# Patient Record
Sex: Male | Born: 1975 | Race: White | Hispanic: No | Marital: Single | Smoking: Former smoker
Health system: Southern US, Community
[De-identification: ages and names within clinical notes are randomized; demographics above are authoritative.]

## PROBLEM LIST (undated history)

## (undated) DIAGNOSIS — F419 Anxiety disorder, unspecified: Secondary | ICD-10-CM

## (undated) DIAGNOSIS — K625 Hemorrhage of anus and rectum: Secondary | ICD-10-CM

## (undated) DIAGNOSIS — E785 Hyperlipidemia, unspecified: Secondary | ICD-10-CM

## (undated) DIAGNOSIS — Z87442 Personal history of urinary calculi: Secondary | ICD-10-CM

## (undated) DIAGNOSIS — M549 Dorsalgia, unspecified: Secondary | ICD-10-CM

## (undated) DIAGNOSIS — Z8249 Family history of ischemic heart disease and other diseases of the circulatory system: Secondary | ICD-10-CM

## (undated) DIAGNOSIS — N200 Calculus of kidney: Secondary | ICD-10-CM

## (undated) DIAGNOSIS — N201 Calculus of ureter: Secondary | ICD-10-CM

## (undated) DIAGNOSIS — K219 Gastro-esophageal reflux disease without esophagitis: Secondary | ICD-10-CM

## (undated) HISTORY — DX: Hemorrhage of anus and rectum: K62.5

## (undated) HISTORY — DX: Family history of ischemic heart disease and other diseases of the circulatory system: Z82.49

## (undated) HISTORY — PX: UMBILICAL HERNIA REPAIR: SHX196

## (undated) HISTORY — DX: Dorsalgia, unspecified: M54.9

## (undated) HISTORY — PX: GANGLION CYST EXCISION: SHX1691

## (undated) HISTORY — DX: Gastro-esophageal reflux disease without esophagitis: K21.9

---
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
## 2005-06-10 ENCOUNTER — Emergency Department (HOSPITAL_COMMUNITY): Admission: EM | Admit: 2005-06-10 | Discharge: 2005-06-10 | Payer: Self-pay | Admitting: Emergency Medicine

## 2005-06-15 ENCOUNTER — Ambulatory Visit (HOSPITAL_COMMUNITY): Admission: RE | Admit: 2005-06-15 | Discharge: 2005-06-15 | Payer: Self-pay | Admitting: Urology

## 2006-01-23 DIAGNOSIS — N200 Calculus of kidney: Secondary | ICD-10-CM

## 2006-01-23 HISTORY — PX: EXTRACORPOREAL SHOCK WAVE LITHOTRIPSY: SHX1557

## 2006-01-23 HISTORY — DX: Calculus of kidney: N20.0

## 2006-10-31 ENCOUNTER — Emergency Department (HOSPITAL_COMMUNITY): Admission: EM | Admit: 2006-10-31 | Discharge: 2006-10-31 | Payer: Self-pay | Admitting: Emergency Medicine

## 2006-11-02 ENCOUNTER — Encounter (HOSPITAL_COMMUNITY): Admission: RE | Admit: 2006-11-02 | Discharge: 2007-01-25 | Payer: Self-pay | Admitting: Emergency Medicine

## 2006-11-26 ENCOUNTER — Ambulatory Visit (HOSPITAL_COMMUNITY): Admission: RE | Admit: 2006-11-26 | Discharge: 2006-11-26 | Payer: Self-pay | Admitting: Gastroenterology

## 2009-01-14 DIAGNOSIS — K219 Gastro-esophageal reflux disease without esophagitis: Secondary | ICD-10-CM | POA: Insufficient documentation

## 2010-06-07 NOTE — Op Note (Signed)
NAME:  Michael [name], Michael [name]                ACCOUNT NO.:  [id number]   MEDICAL RECORD NO.:  [id number]          PATIENT TYPE:  AMB   LOCATION:  ENDO                         FACILITY:  [facility]   PHYSICIAN:  James L. Malon Kindle., M.D.DATE OF BIRTH:  1975/07/29   DATE OF PROCEDURE:  11/26/2006  DATE OF DISCHARGE:                               OPERATIVE REPORT   PROCEDURE:  Esophagogastroduodenoscopy with esophageal dilatation.   MEDICATIONS:  Fentanyl 100 mcg, Versed 10 mg IV.   INDICATION:  Patient with long history of esophageal reflux with  dysphagia .  He has had several episodes of food hanging up.  He has  been on AcipHex b.i.d. for some time.  This is done to evaluate for a  stricture to perform esophageal dilatation.  The benefits and risks of  the procedure have been discussed with him the office.   DESCRIPTION OF PROCEDURE:  Procedure was performed in the [facility]  endoscopy unit using fluoroscopic guidance.  Left lateral decubitus  position, the Pentax upper endoscope was inserted and advanced.  The  distal esophagus was reached.  There was a slight stricture, but it was  fairly open.  There were no esophagitis.  No rings or any other gross  abnormalities.  The stomach was easily entered.  The duodenum including  the bulb and second portion were seen well.  The pyloric channel, antrum  and body of stomach was seen well.  Fundus and cardia were seen well and  were normal.  The scope was placed in the antrum and the Savary guide  wire was threaded through the scope and the scope withdrawn over the  guidewire using fluoroscopic guidance.  Then with the patient's neck  extended we sequentially passed a 12.8, 14, 15 and 16 Savary dilator  using fluoroscopic guidance.  There was no heme.  After passage of the  16 dilator, the scope and guidewire withdrawn as a unit.  There were no  immediate complications.  A total fluoro time was 60 seconds.   ASSESSMENT:  Esophageal stricture dilated  to 16 mm.   PLAN:  Will keep the patient on aggressive therapy for acid, clear  liquids for 4 to 6 hours.  Will go over the precautions with him and see  him back in the office in six weeks.           ______________________________  Llana Aliment Malon Kindle., M.D.     Waldron Session  D:  11/26/2006  T:  11/27/2006  Job:  [id number]   cc:   Caryn Bee L. Little, M.D.  Fax: [phone]

## 2013-04-30 ENCOUNTER — Other Ambulatory Visit: Payer: Self-pay | Admitting: Internal Medicine

## 2013-04-30 DIAGNOSIS — M545 Low back pain, unspecified: Secondary | ICD-10-CM

## 2013-05-05 ENCOUNTER — Other Ambulatory Visit: Payer: Self-pay | Admitting: Internal Medicine

## 2013-05-05 DIAGNOSIS — Z139 Encounter for screening, unspecified: Secondary | ICD-10-CM

## 2013-05-12 ENCOUNTER — Ambulatory Visit
Admission: RE | Admit: 2013-05-12 | Discharge: 2013-05-12 | Disposition: A | Discharge status: Home / Self Care | Payer: 59 | Source: Ambulatory Visit | Attending: Internal Medicine | Admitting: Internal Medicine

## 2013-05-12 DIAGNOSIS — Z139 Encounter for screening, unspecified: Secondary | ICD-10-CM

## 2013-05-12 DIAGNOSIS — M545 Low back pain, unspecified: Secondary | ICD-10-CM

## 2013-05-27 ENCOUNTER — Encounter: Payer: Self-pay | Admitting: Neurology

## 2013-05-29 ENCOUNTER — Ambulatory Visit (INDEPENDENT_AMBULATORY_CARE_PROVIDER_SITE_OTHER): Payer: 59 | Admitting: Neurology

## 2013-05-29 ENCOUNTER — Encounter (INDEPENDENT_AMBULATORY_CARE_PROVIDER_SITE_OTHER): Payer: Self-pay

## 2013-05-29 ENCOUNTER — Ambulatory Visit (INDEPENDENT_AMBULATORY_CARE_PROVIDER_SITE_OTHER): Payer: Self-pay

## 2013-05-29 DIAGNOSIS — Z0289 Encounter for other administrative examinations: Secondary | ICD-10-CM

## 2013-05-29 DIAGNOSIS — R209 Unspecified disturbances of skin sensation: Secondary | ICD-10-CM

## 2013-05-29 DIAGNOSIS — M5432 Sciatica, left side: Secondary | ICD-10-CM

## 2013-05-29 DIAGNOSIS — M79609 Pain in unspecified limb: Secondary | ICD-10-CM

## 2013-05-29 NOTE — Procedures (Signed)
     HISTORY:  Michael [name] is a 38 year old gentleman with a two-year history of discomfort in the left buttock going down the left leg. He has some sensation of weakness in the leg off and on. The pain is worse with prolonged standing. He is being evaluated for a possible neuropathy or a lumbosacral radiculopathy.  NERVE CONDUCTION STUDIES:  Nerve conduction studies were performed on both lower extremities. The distal motor latencies and motor amplitudes for the peroneal and posterior tibial nerves were within normal limits. The nerve conduction velocities for these nerves were also normal. The H reflex latencies were normal. The sensory latencies for the peroneal nerves were within normal limits.   EMG STUDIES:  EMG study was performed on the left lower extremity:  The tibialis anterior muscle reveals 2 to 4K motor units with full recruitment. No fibrillations or positive waves were seen. The peroneus tertius muscle reveals 2 to 4K motor units with full recruitment. No fibrillations or positive waves were seen. The medial gastrocnemius muscle reveals 1 to 3K motor units with full recruitment. One plus fibrillations and positive waves were seen. The vastus lateralis muscle reveals 2 to 4K motor units with full recruitment. No fibrillations or positive waves were seen. The iliopsoas muscle reveals 2 to 4K motor units with full recruitment. No fibrillations or positive waves were seen. The biceps femoris muscle (long head) reveals 2 to 4K motor units with full recruitment. No fibrillations or positive waves were seen. The lumbosacral paraspinal muscles were tested at 3 levels, and revealed no abnormalities of insertional activity at all 3 levels tested. There was good relaxation.   IMPRESSION:  Nerve conduction studies done on both lower extremities were unremarkable, without evidence of an overlying peripheral neuropathy. EMG evaluation of the left lower extremity shows minimal isolated  acute denervation in the medial gastrocnemius muscle. No other abnormal findings were noted. The clinical significance of this is not clear, but the possibility of a low-grade sciatic neuropathy or S1 radiculopathy should be considered. Clinical correlation is required.  Jill Alexanders MD 05/29/2013 12:39 PM  [facility] [address] [place],  [address]  Phone [phone] Fax [phone]

## 2016-09-18 ENCOUNTER — Encounter (HOSPITAL_COMMUNITY): Payer: Self-pay | Admitting: *Deleted

## 2016-09-18 ENCOUNTER — Ambulatory Visit (HOSPITAL_COMMUNITY)
Admission: EM | Admit: 2016-09-18 | Discharge: 2016-09-18 | Disposition: A | Discharge status: Home / Self Care | Payer: 59 | Attending: Family Medicine | Admitting: Family Medicine

## 2016-09-18 DIAGNOSIS — R42 Dizziness and giddiness: Secondary | ICD-10-CM

## 2016-09-18 DIAGNOSIS — R110 Nausea: Secondary | ICD-10-CM

## 2016-09-18 DIAGNOSIS — H8309 Labyrinthitis, unspecified ear: Secondary | ICD-10-CM

## 2016-09-18 MED ORDER — MECLIZINE HCL 25 MG PO TABS: 25.0000 mg | ORAL_TABLET | Freq: Three times a day (TID) | ORAL | 0 refills | Status: DC | PRN

## 2016-09-18 MED ORDER — ONDANSETRON 8 MG PO TBDP: 8.0000 mg | ORAL_TABLET | Freq: Three times a day (TID) | ORAL | 0 refills | Status: DC | PRN

## 2016-09-18 NOTE — ED Provider Notes (Signed)
  [place]   [id number] 09/18/16 Arrival Time: 1925  ASSESSMENT & PLAN:  1. Vertigo   2. Nausea without vomiting   3. Labyrinthitis, unspecified laterality     Meds ordered this encounter  Medications  . meclizine (ANTIVERT) 25 MG tablet    Sig: Take 1 tablet (25 mg total) by mouth 3 (three) times daily as needed for dizziness.    Dispense:  30 tablet    Refill:  0    Order Specific Question:   Supervising Provider    Answer:   Sherlene Shams [[id number]]  . ondansetron (ZOFRAN ODT) 8 MG disintegrating tablet    Sig: Take 1 tablet (8 mg total) by mouth every 8 (eight) hours as needed for nausea or vomiting.    Dispense:  20 tablet    Refill:  0    Order Specific Question:   Supervising Provider    Answer:   Sherlene Shams [[id number]]    Reviewed expectations re: course of current medical issues. Questions answered. Outlined signs and symptoms indicating need for more acute intervention. Patient verbalized understanding. After Visit Summary given.   SUBJECTIVE:  Michael [name] is a 41 y.o. male who presents with complaint of dizziness and nausea today at work and feels off balance.  ROS: As per HPI.   OBJECTIVE:  Vitals:   09/18/16 2025  BP: 133/78  Pulse: 78  Resp: 17  Temp: 98.7 F (37.1 C)  TempSrc: Oral  SpO2: 100%     General appearance: alert; no distress Eyes: PERRLA; EOMI; conjunctiva normal HENT: normocephalic; atraumatic; TMs normal; nasal mucosa normal; oral mucosa normal Neck: supple Lungs: clear to auscultation bilaterally Heart: regular rate and rhythm Abdomen: soft, non-tender; bowel sounds normal; no masses or organomegaly; no guarding or rebound tenderness Back: no CVA tenderness Extremities: no cyanosis or edema; symmetrical with no gross deformities Skin: warm and dry Neurologic: normal gait; normal symmetric reflexes Psychological: alert and cooperative; normal mood and affect  Past Medical History:  Diagnosis Date  . Back  pain   . FH: heart disease   . GERD (gastroesophageal reflux disease)   . Rectal bleeding      has a past medical history of Back pain; FH: heart disease; GERD (gastroesophageal reflux disease); and Rectal bleeding.  No results found for this or any previous visit.  Labs Reviewed - No data to display  Imaging: No results found.  Allergies no known allergies  Family History  Problem Relation Age of Onset  . Diabetes Father    Past Surgical History:  Procedure Laterality Date  . GANGLION CYST EXCISION           Lysbeth Penner, [place] 09/18/16 2038

## 2016-09-18 NOTE — ED Triage Notes (Signed)
Patient states today he had sudden onset of dizziness at work today. States he feels off balance. Patient is alert and oriented, stroke scale negative, no headache.

## 2017-05-15 HISTORY — PX: UMBILICAL HERNIA REPAIR: SHX196

## 2018-05-06 ENCOUNTER — Emergency Department (HOSPITAL_COMMUNITY): Payer: 59

## 2018-05-06 ENCOUNTER — Encounter (HOSPITAL_COMMUNITY): Payer: Self-pay | Admitting: Emergency Medicine

## 2018-05-06 ENCOUNTER — Emergency Department (HOSPITAL_COMMUNITY)
Admission: EM | Admit: 2018-05-06 | Discharge: 2018-05-06 | Disposition: A | Discharge status: Home / Self Care | Payer: 59 | Attending: Emergency Medicine | Admitting: Emergency Medicine

## 2018-05-06 DIAGNOSIS — R1032 Left lower quadrant pain: Secondary | ICD-10-CM | POA: Diagnosis present

## 2018-05-06 DIAGNOSIS — N200 Calculus of kidney: Secondary | ICD-10-CM | POA: Diagnosis not present

## 2018-05-06 DIAGNOSIS — N201 Calculus of ureter: Secondary | ICD-10-CM | POA: Diagnosis not present

## 2018-05-06 DIAGNOSIS — Z87891 Personal history of nicotine dependence: Secondary | ICD-10-CM | POA: Insufficient documentation

## 2018-05-06 HISTORY — DX: Calculus of kidney: N20.0

## 2018-05-06 LAB — URINALYSIS, ROUTINE W REFLEX MICROSCOPIC
Bilirubin Urine: NEGATIVE
Glucose, UA: NEGATIVE mg/dL
Ketones, ur: NEGATIVE mg/dL
Leukocytes,Ua: NEGATIVE
Nitrite: NEGATIVE
Protein, ur: NEGATIVE mg/dL
RBC / HPF: >50 RBC/hpf — ABNORMAL HIGH (ref 0–5)
Specific Gravity, Urine: 1.015 (ref 1.005–1.030)
pH: 7 (ref 5.0–8.0)

## 2018-05-06 MED ORDER — OXYCODONE-ACETAMINOPHEN 5-325 MG PO TABS: 1.0000 | ORAL_TABLET | Freq: Four times a day (QID) | ORAL | 0 refills | Status: DC | PRN

## 2018-05-06 MED ORDER — ONDANSETRON HCL 4 MG/2ML IJ SOLN
4.0000 mg | Freq: Once | INTRAMUSCULAR | Status: AC
  Administered 2018-05-06: 4 mg via INTRAVENOUS
  Filled 2018-05-06: qty 2

## 2018-05-06 MED ORDER — TAMSULOSIN HCL 0.4 MG PO CAPS: 0.4000 mg | ORAL_CAPSULE | Freq: Every day | ORAL | 0 refills | Status: DC

## 2018-05-06 MED ORDER — KETOROLAC TROMETHAMINE 30 MG/ML IJ SOLN
30.0000 mg | Freq: Once | INTRAMUSCULAR | Status: AC
  Administered 2018-05-06: 30 mg via INTRAVENOUS
  Filled 2018-05-06: qty 1

## 2018-05-06 MED ORDER — TAMSULOSIN HCL 0.4 MG PO CAPS
0.4000 mg | ORAL_CAPSULE | Freq: Once | ORAL | Status: AC
  Administered 2018-05-06: 0.4 mg via ORAL
  Filled 2018-05-06: qty 1

## 2018-05-06 MED ORDER — SODIUM CHLORIDE 0.9 % IV BOLUS
1000.0000 mL | Freq: Once | INTRAVENOUS | Status: AC
  Administered 2018-05-06: 1000 mL via INTRAVENOUS

## 2018-05-06 MED ORDER — ONDANSETRON 4 MG PO TBDP: 4.0000 mg | ORAL_TABLET | Freq: Three times a day (TID) | ORAL | 0 refills | Status: DC | PRN

## 2018-05-06 MED ORDER — OXYCODONE-ACETAMINOPHEN 5-325 MG PO TABS
1.0000 | ORAL_TABLET | Freq: Once | ORAL | Status: AC
  Administered 2018-05-06: 1 via ORAL
  Filled 2018-05-06: qty 1

## 2018-05-06 MED ORDER — HYDROMORPHONE HCL 1 MG/ML IJ SOLN
1.0000 mg | Freq: Once | INTRAMUSCULAR | Status: AC
  Administered 2018-05-06: 1 mg via INTRAVENOUS
  Filled 2018-05-06: qty 1

## 2018-05-06 NOTE — Discharge Instructions (Signed)
Please read instructions below. Drink plenty of water. You can take Percocet every 6 hours as needed for pain. You can take Zofran every 6 hours as needed for nausea. Take flomax once per day for bladder spasm. Follow up with Urology to discuss your diagnosis. You have multiple stones, some are very large, in both kidneys. Return to the ER for fever, chills, uncontrollable vomiting, or worsening symptoms.

## 2018-05-06 NOTE — ED Provider Notes (Signed)
[place] DEPT Provider Note   CSN: [id number] Arrival date & time: 05/06/18  1630    History   Chief Complaint Chief Complaint  Patient presents with   Flank Pain    HPI Michael [name] is a 43 y.o. male with past medical history of nephrolithiasis, presenting to the emergency department with complaint of acute onset of left flank pain that began earlier today.  Pain is in the left flank and radiates to left lower abdomen.  Patient states pain feels exactly like prior episode of nephrolithiasis.  He states last time he had a stone was in 2008, which required lithotripsy because it was 9 mm in diameter.  His urologist at the time was with [facility].  Today he treated his pain with 800 mg of ibuprofen around 1 PM without any relief.  He has associated nausea without vomiting.  He has some mild dysuria and his urine appears a little bit darker in color.  He had one episode of diarrhea today as well.  No fevers, vomiting, or other complaints.  Past abdominal surgeries include umbilical hernia repair last year.     The history is provided by the patient.    Past Medical History:  Diagnosis Date   Back pain    FH: heart disease    GERD (gastroesophageal reflux disease)    Nephrolithiasis 2008   Rectal bleeding     Patient Active Problem List   Diagnosis Date Noted   Nephrolithiasis 01/23/2006    Past Surgical History:  Procedure Laterality Date   GANGLION CYST EXCISION     UMBILICAL HERNIA REPAIR          Home Medications    Prior to Admission medications   Medication Sig Start Date End Date Taking? Authorizing Provider  ibuprofen (ADVIL,MOTRIN) 200 MG tablet Take 200 mg by mouth every 6 (six) hours as needed.    [provider]  meclizine (ANTIVERT) 25 MG tablet Take 1 tablet (25 mg total) by mouth 3 (three) times daily as needed for dizziness. 09/18/16   Lysbeth Penner, FNP  ondansetron (ZOFRAN ODT) 4 MG  disintegrating tablet Take 1 tablet (4 mg total) by mouth every 8 (eight) hours as needed for nausea or vomiting. 05/06/18   Crew Goren, [place] N, PA-C  oxyCODONE-acetaminophen (PERCOCET/ROXICET) 5-325 MG tablet Take 1-2 tablets by mouth every 6 (six) hours as needed for severe pain. 05/06/18   Delio Slates, [place] N, PA-C  tamsulosin (FLOMAX) 0.4 MG CAPS capsule Take 1 capsule (0.4 mg total) by mouth daily. 05/06/18   Calleigh Lafontant, [place] N, PA-C    Family History Family History  Problem Relation Age of Onset   Diabetes Father     Social History Social History   Tobacco Use   Smoking status: Former Smoker   Smokeless tobacco: Never Used  Substance Use Topics   Alcohol use: Yes    Comment: 2-4 beer per day   Drug use: No     Allergies   Patient has no known allergies.   Review of Systems Review of Systems  Constitutional: Negative for fever.  Gastrointestinal: Positive for abdominal pain, diarrhea and nausea. Negative for vomiting.  Genitourinary: Positive for dysuria and flank pain.  All other systems reviewed and are negative.    Physical Exam Updated Vital Signs BP (!) 144/99    Pulse 66    Temp 97.9 F (36.6 C) (Oral)    Resp 15    Ht 6' (1.829 m)  Wt 95.3 kg    SpO2 94%    BMI 28.48 kg/m   Physical Exam Vitals signs and nursing note reviewed.  Constitutional:      Appearance: He is well-developed.  HENT:     Head: Normocephalic and atraumatic.     Mouth/Throat:     Mouth: Mucous membranes are moist.  Eyes:     Conjunctiva/sclera: Conjunctivae normal.  Cardiovascular:     Rate and Rhythm: Normal rate and regular rhythm.  Pulmonary:     Effort: Pulmonary effort is normal. No respiratory distress.     Breath sounds: Normal breath sounds.  Abdominal:     General: Bowel sounds are normal.     Palpations: Abdomen is soft.     Tenderness: There is no abdominal tenderness. There is left CVA tenderness. There is no guarding or rebound.  Skin:    General: Skin is  warm.  Neurological:     Mental Status: He is alert.  Psychiatric:        Behavior: Behavior normal.      ED Treatments / Results  Labs (all labs ordered are listed, but only abnormal results are displayed) Labs Reviewed  URINALYSIS, ROUTINE W REFLEX MICROSCOPIC - Abnormal; Notable for the following components:      Result Value   APPearance CLOUDY (*)    Hgb urine dipstick MODERATE (*)    RBC / HPF >50 (*)    Bacteria, UA RARE (*)    All other components within normal limits  URINE CULTURE    EKG None  Radiology Ct Renal Stone Study  Result Date: 05/06/2018 CLINICAL DATA:  43 year old male with left-sided flank pain and history of renal stones EXAM: CT ABDOMEN AND PELVIS WITHOUT CONTRAST TECHNIQUE: Multidetector CT imaging of the abdomen and pelvis was performed following the standard protocol without IV contrast. COMPARISON:  03/10/2014 FINDINGS: Lower chest: No acute finding Hepatobiliary: Diffusely decreased attenuation of liver parenchyma. Unremarkable gallbladder. Pancreas: Unremarkable Spleen: Unremarkable Adrenals/Urinary Tract: Unremarkable adrenal glands. Right kidney demonstrates multiple (at least 6 or 7) small nonobstructive stones. The largest in the superior pole collecting system measures 8 mm-9 mm. No hydronephrosis or perinephric stranding. Unremarkable course of the right ureter. Left kidney demonstrates mild hydronephrosis with a proximal left ureteral stone measuring approximately 4 mm. There are additional stones within the left collecting system (at least 6 or 7). Largest non-obstructing left renal stone in the interpolar collecting system measuring 8 mm-9 mm. Mild perinephric stranding. Distal left ureter unremarkable. Unremarkable urinary bladder. Stomach/Bowel: Unremarkable stomach. Unremarkable small bowel. Normal appendix. Colonic diverticula without associated inflammation. Vascular/Lymphatic: No significant atherosclerosis. No lymphadenopathy. Reproductive:  Calcifications of the prostate, otherwise unremarkable pelvic structures Other: Fat containing umbilical hernia. Musculoskeletal: No displaced fracture. IMPRESSION: Obstructing proximal left ureteral stone measuring 4 mm, with mild left hydronephrosis. If there is concern for ascending urinary tract infection, recommend correlation with urinalysis. Multiple bilateral nonobstructing nephrolithiasis. Liver steatosis. Additional ancillary findings as above. Electronically Signed   By: Corrie Mckusick D.O.   On: 05/06/2018 18:14    Procedures Procedures (including critical care time)  Medications Ordered in ED Medications  oxyCODONE-acetaminophen (PERCOCET/ROXICET) 5-325 MG per tablet 1 tablet (has no administration in time range)  tamsulosin (FLOMAX) capsule 0.4 mg (has no administration in time range)  ketorolac (TORADOL) 30 MG/ML injection 30 mg (has no administration in time range)  ondansetron (ZOFRAN) injection 4 mg (4 mg Intravenous Given 05/06/18 1709)  sodium chloride 0.9 % bolus 1,000 mL (0 mLs Intravenous Stopped  05/06/18 1815)  HYDROmorphone (DILAUDID) injection 1 mg (1 mg Intravenous Given 05/06/18 1709)     Initial Impression / Assessment and Plan / ED Course  I have reviewed the triage vital signs and the nursing notes.  Pertinent labs & imaging results that were available during my care of the patient were reviewed by me and considered in my medical decision making (see chart for details).        Pt presenting with flank pain with assoc nausea. Diagnosis of 84m left ureteral stone made via CT. There is no evidence of significant hydronephrosis, vitals sign stable and the pt does not have intractable vomiting. U/A neg for infection. Multiple stones present in b/l kidneys, some measuring 8-939m Encouraged he follow up with his urologist to discuss further management. Pain adequately managed in the ED. Pt will be discharged home with pain medications. Strict return precautions  discussed.  Discussed results, findings, treatment and follow up. Patient advised of return precautions. Patient verbalized understanding and agreed with plan.  [place]rolled [place] queried   Final Clinical Impressions(s) / ED Diagnoses   Final diagnoses:  Left ureteral stone  Nephrolithiasis    ED Discharge Orders         Ordered    oxyCODONE-acetaminophen (PERCOCET/ROXICET) 5-325 MG tablet  Every 6 hours PRN     05/06/18 1934    tamsulosin (FLOMAX) 0.4 MG CAPS capsule  Daily     05/06/18 1934    ondansetron (ZOFRAN ODT) 4 MG disintegrating tablet  Every 8 hours PRN     05/06/18 1934           Jarae Panas, [place], PA-C 05/06/18 2010    ReQuintella ReichertMD 05/06/18 2224

## 2018-05-06 NOTE — ED Triage Notes (Signed)
Left sided flank pain, onset last night. +urinary symptoms, hx of kidney stone (2008), and nausea. Pain 10/10.

## 2018-05-08 LAB — URINE CULTURE: Culture: <10000 — AB

## 2018-10-11 ENCOUNTER — Other Ambulatory Visit: Payer: Self-pay

## 2018-10-11 DIAGNOSIS — Z20822 Contact with and (suspected) exposure to covid-19: Secondary | ICD-10-CM

## 2018-10-13 LAB — NOVEL CORONAVIRUS, NAA: SARS-CoV-2, NAA: NOT DETECTED

## 2018-10-23 ENCOUNTER — Other Ambulatory Visit: Payer: Self-pay | Admitting: Internal Medicine

## 2018-10-23 DIAGNOSIS — R079 Chest pain, unspecified: Secondary | ICD-10-CM

## 2018-12-02 ENCOUNTER — Other Ambulatory Visit: Payer: Self-pay

## 2018-12-02 ENCOUNTER — Ambulatory Visit: Payer: 59

## 2018-12-02 DIAGNOSIS — R079 Chest pain, unspecified: Secondary | ICD-10-CM

## 2018-12-09 ENCOUNTER — Other Ambulatory Visit: Payer: Self-pay

## 2018-12-09 ENCOUNTER — Ambulatory Visit (INDEPENDENT_AMBULATORY_CARE_PROVIDER_SITE_OTHER): Payer: 59

## 2018-12-09 DIAGNOSIS — R079 Chest pain, unspecified: Secondary | ICD-10-CM | POA: Diagnosis not present

## 2020-02-18 DIAGNOSIS — U071 COVID-19: Secondary | ICD-10-CM

## 2020-02-18 HISTORY — DX: COVID-19: U07.1

## 2020-04-14 ENCOUNTER — Emergency Department (HOSPITAL_COMMUNITY)
Admission: EM | Admit: 2020-04-14 | Discharge: 2020-04-14 | Disposition: A | Discharge status: Home / Self Care | Payer: Managed Care, Other (non HMO) | Attending: Emergency Medicine | Admitting: Emergency Medicine

## 2020-04-14 ENCOUNTER — Other Ambulatory Visit: Payer: Self-pay

## 2020-04-14 ENCOUNTER — Encounter (HOSPITAL_COMMUNITY): Payer: Self-pay

## 2020-04-14 ENCOUNTER — Emergency Department (HOSPITAL_COMMUNITY): Payer: Managed Care, Other (non HMO)

## 2020-04-14 DIAGNOSIS — N23 Unspecified renal colic: Secondary | ICD-10-CM | POA: Diagnosis not present

## 2020-04-14 DIAGNOSIS — Z87891 Personal history of nicotine dependence: Secondary | ICD-10-CM | POA: Insufficient documentation

## 2020-04-14 DIAGNOSIS — K219 Gastro-esophageal reflux disease without esophagitis: Secondary | ICD-10-CM | POA: Insufficient documentation

## 2020-04-14 DIAGNOSIS — R109 Unspecified abdominal pain: Secondary | ICD-10-CM | POA: Diagnosis present

## 2020-04-14 LAB — CBC WITH DIFFERENTIAL/PLATELET
Abs Immature Granulocytes: 0.01 10*3/uL (ref 0.00–0.07)
Basophils Absolute: 0.1 10*3/uL (ref 0.0–0.1)
Basophils Relative: 1 %
Eosinophils Absolute: 0.2 10*3/uL (ref 0.0–0.5)
Eosinophils Relative: 2 %
HCT: 46.8 % (ref 39.0–52.0)
Hemoglobin: 15.7 g/dL (ref 13.0–17.0)
Immature Granulocytes: 0 %
Lymphocytes Relative: 24 %
Lymphs Abs: 1.6 10*3/uL (ref 0.7–4.0)
MCH: 30.8 pg (ref 26.0–34.0)
MCHC: 33.5 g/dL (ref 30.0–36.0)
MCV: 91.9 fL (ref 80.0–100.0)
Monocytes Absolute: 0.6 10*3/uL (ref 0.1–1.0)
Monocytes Relative: 9 %
Neutro Abs: 4.2 10*3/uL (ref 1.7–7.7)
Neutrophils Relative %: 64 %
Platelets: 286 10*3/uL (ref 150–400)
RBC: 5.09 MIL/uL (ref 4.22–5.81)
RDW: 11.9 % (ref 11.5–15.5)
WBC: 6.5 10*3/uL (ref 4.0–10.5)
nRBC: 0 % (ref 0.0–0.2)

## 2020-04-14 LAB — COMPREHENSIVE METABOLIC PANEL
ALT: 47 U/L — ABNORMAL HIGH (ref 0–44)
AST: 27 U/L (ref 15–41)
Albumin: 4.7 g/dL (ref 3.5–5.0)
Alkaline Phosphatase: 62 U/L (ref 38–126)
Anion gap: 7 (ref 5–15)
BUN: 14 mg/dL (ref 6–20)
CO2: 27 mmol/L (ref 22–32)
Calcium: 9.7 mg/dL (ref 8.9–10.3)
Chloride: 103 mmol/L (ref 98–111)
Creatinine, Ser: 0.96 mg/dL (ref 0.61–1.24)
GFR, Estimated: >60 mL/min (ref >60)
Glucose, Bld: 101 mg/dL — ABNORMAL HIGH (ref 70–99)
Potassium: 4.2 mmol/L (ref 3.5–5.1)
Sodium: 137 mmol/L (ref 135–145)
Total Bilirubin: 1.1 mg/dL (ref 0.3–1.2)
Total Protein: 7.2 g/dL (ref 6.5–8.1)

## 2020-04-14 LAB — URINALYSIS, ROUTINE W REFLEX MICROSCOPIC
Bilirubin Urine: NEGATIVE
Glucose, UA: NEGATIVE mg/dL
Hgb urine dipstick: NEGATIVE
Ketones, ur: NEGATIVE mg/dL
Leukocytes,Ua: NEGATIVE
Nitrite: NEGATIVE
Protein, ur: NEGATIVE mg/dL
Specific Gravity, Urine: 1.008 (ref 1.005–1.030)
pH: 7 (ref 5.0–8.0)

## 2020-04-14 LAB — LIPASE, BLOOD: Lipase: 33 U/L (ref 11–51)

## 2020-04-14 MED ORDER — OXYCODONE-ACETAMINOPHEN 5-325 MG PO TABS
1.0000 | ORAL_TABLET | Freq: Once | ORAL | Status: AC
  Administered 2020-04-14: 1 via ORAL
  Filled 2020-04-14: qty 1

## 2020-04-14 MED ORDER — TAMSULOSIN HCL 0.4 MG PO CAPS: 0.4000 mg | ORAL_CAPSULE | Freq: Every day | ORAL | 0 refills | Status: AC

## 2020-04-14 MED ORDER — ONDANSETRON HCL 4 MG PO TABS
4.0000 mg | ORAL_TABLET | Freq: Once | ORAL | Status: AC
  Administered 2020-04-14: 4 mg via ORAL
  Filled 2020-04-14: qty 1

## 2020-04-14 MED ORDER — ONDANSETRON 4 MG PO TBDP: 4.0000 mg | ORAL_TABLET | Freq: Three times a day (TID) | ORAL | 0 refills | Status: DC | PRN

## 2020-04-14 MED ORDER — OXYCODONE-ACETAMINOPHEN 5-325 MG PO TABS: 1.0000 | ORAL_TABLET | ORAL | 0 refills | Status: DC | PRN

## 2020-04-14 NOTE — ED Notes (Signed)
The pt reports that he feels better now

## 2020-04-14 NOTE — ED Provider Notes (Signed)
[place] EMERGENCY DEPARTMENT Provider Note   CSN: [id number] Arrival date & time: 04/14/20  1054     History No chief complaint on file.   Michael [name] is a 45 y.o. male with a history of nephrolithiasis, and GERD.  Patient presents with chief complaint of left flank pain.  Patient reports his pain started earlier this morning while he was at work.  Patient reports pain is a constant "nagging sensation," in his left flank with intermittent increase in pain described as a "stabbing."  Is any radiation of his pain.  At present patient rates pain 7/10 on the pain scale.  Reports associated nausea.  Patient reports this feels exactly like his previous kidney stones.  Per chart review patient had kidney stone in 04/2018 and 2008.  Patient reports that he sees Dr. Jeffie Pollock with [facility].    Patient denies any fever, chills, abdominal pain, diarrhea constipation, GU complaints.    HPI     Past Medical History:  Diagnosis Date  . Back pain   . FH: heart disease   . GERD (gastroesophageal reflux disease)   . Nephrolithiasis 2008  . Rectal bleeding     Patient Active Problem List   Diagnosis Date Noted  . Nephrolithiasis 01/23/2006    Past Surgical History:  Procedure Laterality Date  . GANGLION CYST EXCISION    . UMBILICAL HERNIA REPAIR         Family History  Problem Relation Age of Onset  . Diabetes Father     Social History   Tobacco Use  . Smoking status: Former Research scientist (life sciences)  . Smokeless tobacco: Never Used  Substance Use Topics  . Alcohol use: Yes    Comment: 2-4 beer per day  . Drug use: No    Home Medications Prior to Admission medications   Medication Sig Start Date End Date Taking? Authorizing Provider  ibuprofen (ADVIL,MOTRIN) 200 MG tablet Take 200 mg by mouth every 6 (six) hours as needed.    [provider]  meclizine (ANTIVERT) 25 MG tablet Take 1 tablet (25 mg total) by mouth 3 (three) times daily as needed for  dizziness. 09/18/16   Lysbeth Penner, FNP  ondansetron (ZOFRAN ODT) 4 MG disintegrating tablet Take 1 tablet (4 mg total) by mouth every 8 (eight) hours as needed for nausea or vomiting. 05/06/18   Robinson, [place] N, PA-C  oxyCODONE-acetaminophen (PERCOCET/ROXICET) 5-325 MG tablet Take 1-2 tablets by mouth every 6 (six) hours as needed for severe pain. 05/06/18   Robinson, [place] N, PA-C  tamsulosin (FLOMAX) 0.4 MG CAPS capsule Take 1 capsule (0.4 mg total) by mouth daily. 05/06/18   Robinson, [place] N, PA-C    Allergies    Patient has no known allergies.  Review of Systems   Review of Systems  Constitutional: Negative for chills and fever.  Eyes: Negative for visual disturbance.  Respiratory: Negative for cough and shortness of breath.   Cardiovascular: Negative for chest pain.  Gastrointestinal: Negative for abdominal pain, nausea and vomiting.  Genitourinary: Positive for flank pain. Negative for decreased urine volume, difficulty urinating, dysuria, frequency, genital sores, hematuria, penile discharge, penile pain, penile swelling, scrotal swelling and testicular pain.  Musculoskeletal: Negative for back pain and neck pain.  Skin: Negative for color change and rash.  Neurological: Negative for dizziness, syncope, light-headedness and headaches.  Psychiatric/Behavioral: Negative for confusion.    Physical Exam Updated Vital Signs BP 137/89 (BP Location: Left Arm)   Pulse 76   Temp  98.4 F (36.9 C)   Resp 16   SpO2 99%   Physical Exam Vitals and nursing note reviewed.  Constitutional:      General: He is not in acute distress.    Appearance: He is not ill-appearing, toxic-appearing or diaphoretic.  HENT:     Head: Normocephalic and atraumatic.  Eyes:     General: No scleral icterus.       Right eye: No discharge.        Left eye: No discharge.  Cardiovascular:     Rate and Rhythm: Normal rate.     Heart sounds: Normal heart sounds.  Pulmonary:     Effort: Pulmonary  effort is normal. No tachypnea, bradypnea or respiratory distress.     Breath sounds: Normal breath sounds. No stridor.  Abdominal:     General: A surgical scar is present. Bowel sounds are normal. There is no distension.     Palpations: Abdomen is soft. There is no mass or pulsatile mass.     Tenderness: There is no abdominal tenderness. There is no right CVA tenderness, left CVA tenderness, guarding or rebound.     Hernia: There is no hernia in the umbilical area or ventral area.     Comments: Surgical scar below umbilicus from previous hernia repair  Musculoskeletal:     Cervical back: Normal range of motion and neck supple.  Skin:    General: Skin is warm and dry.     Coloration: Skin is not jaundiced or pale.  Neurological:     General: No focal deficit present.     Mental Status: He is alert.  Psychiatric:        Behavior: Behavior is cooperative.     ED Results / Procedures / Treatments   Labs (all labs ordered are listed, but only abnormal results are displayed) Labs Reviewed  URINALYSIS, ROUTINE W REFLEX MICROSCOPIC - Abnormal; Notable for the following components:      Result Value   Color, Urine STRAW (*)    All other components within normal limits  COMPREHENSIVE METABOLIC PANEL - Abnormal; Notable for the following components:   Glucose, Bld 101 (*)    ALT 47 (*)    All other components within normal limits  URINE CULTURE  CBC WITH DIFFERENTIAL/PLATELET  LIPASE, BLOOD    EKG None  Radiology CT Renal Stone Study  Result Date: 04/14/2020 CLINICAL DATA:  Left flank pain for 2 hours. History of kidney stones. EXAM: CT ABDOMEN AND PELVIS WITHOUT CONTRAST TECHNIQUE: Multidetector CT imaging of the abdomen and pelvis was performed following the standard protocol without IV contrast. COMPARISON:  CT abdomen and pelvis 05/06/2018. FINDINGS: Lower chest: Lung bases clear.  No pleural or pericardial effusion. Hepatobiliary: No focal liver abnormality is seen. No  gallstones, gallbladder wall thickening, or biliary dilatation. Pancreas: Unremarkable. No pancreatic ductal dilatation or surrounding inflammatory changes. Spleen: Normal in size without focal abnormality. Adrenals/Urinary Tract: The adrenal glands appear normal. The patient has multiple nonobstructing bilateral renal stones. One of the largest on the right is in the upper pole and measures 0.8 cm and the largest on the left is in the midpole and measures 1 cm. There is no hydronephrosis on the right or left but the patient does have a 0.5 cm right ureteral stone at approximately the level of the superior endplate of L4. Urinary bladder is negative. Stomach/Bowel: Stomach is within normal limits. Appendix appears normal. No evidence of bowel wall thickening, distention, or inflammatory changes. Vascular/Lymphatic: No  significant vascular findings are present. No enlarged abdominal or pelvic lymph nodes. Reproductive: Prostate is unremarkable. Other: None. Musculoskeletal: No acute or focal abnormality. IMPRESSION: Multiple bilateral nonobstructing renal stones. The patient does have a 0.5 cm stone in the right ureter approximately at the level of the superior endplate of L4. There is no hydronephrosis on the right or left. Negative for left ureteral stone. Electronically Signed   By: Inge Rise M.D.   On: 04/14/2020 15:26    Procedures Procedures   Medications Ordered in ED Medications  oxyCODONE-acetaminophen (PERCOCET/ROXICET) 5-325 MG per tablet 1 tablet (1 tablet Oral Given 04/14/20 1458)  ondansetron (ZOFRAN) tablet 4 mg (4 mg Oral Given 04/14/20 1458)    ED Course  I have reviewed the triage vital signs and the nursing notes.  Pertinent labs & imaging results that were available during my care of the patient were reviewed by me and considered in my medical decision making (see chart for details).    MDM Rules/Calculators/A&P                          Alert 45 year old male in no acute  distress, nontoxic-appearing.  Patient presents with complaint of left flank pain.  Patient has history of nephrolithiasis.  Patient last nephrolithiasis on on 04/2018.  Patient reports pain today feels exactly like previous episodes of kidney stones.  She is followed by Dr.Wrenn [facility].  Physical exam normoactive bowel sounds, abdomen soft, nondistended, nontender no mass, pulsatile mass, umbilical or ventral hernia.  No CVA tenderness.  Will obtain UA, urine culture, CBC, CMP, lipase and CT renal study. Patient given Percocet and Zofran for symptoms.  Lipase within normal limits; low suspicion for acute pancreatitis. CBC is unremarkable. CMP shows ALT slightly elevated at 47, glucose slightly elevated at 101; all other values within normal limits. UA shows no sign of infection.  CT renal study shows multiple bilateral nonobstructing renal stones.  The patient does have a 0.5 cm stone in the right ureter approximately at the level of the superior endplate of L4.  There is no hydronephrosis on the right or left.  Negative for left ureteral stone.  Suspect that patient's left flank pain may be musculoskeletal in nature as he works in a warehouse with manual lifting involved.  We will have patient follow-up with his urologist Dr.Wrenn.  Patient reports that he has an appointment scheduled for tomorrow.  Patient given prescription for Flomax, oxycodone, and Zofran.  Discussed results, findings, treatment and follow up. Patient advised of return precautions. Patient verbalized understanding and agreed with plan.    Final Clinical Impression(s) / ED Diagnoses Final diagnoses:  Renal colic on right side    Rx / DC Orders ED Discharge Orders         Ordered    oxyCODONE-acetaminophen (PERCOCET/ROXICET) 5-325 MG tablet  Every 4 hours PRN        04/14/20 1546    ondansetron (ZOFRAN ODT) 4 MG disintegrating tablet  Every 8 hours PRN        04/14/20 1546    tamsulosin (FLOMAX) 0.4 MG  CAPS capsule  Daily        04/14/20 1546           Dyann Ruddle 04/14/20 2200    Valarie Merino, MD 04/15/20 [phone]

## 2020-04-14 NOTE — ED Notes (Signed)
Pt transported to CT ?

## 2020-04-14 NOTE — ED Provider Notes (Signed)
Pt is feeling much better.  He had a CT which showed a ureteral stone on the right.  His pain was on the left, but it's possible he had a stone on the left which he passed as he has several stones in his kidneys.  Pt has an appt with urology tomorrow.  He is stable for d/c.  Return if worse.   Isla Pence, MD 04/14/20 [phone]

## 2020-04-14 NOTE — ED Triage Notes (Signed)
Patient complains of left flank x 2 hours. Nausea with same. States that he knows its a kidney stone, hx of same. Patient alert and oriented.

## 2020-04-15 LAB — URINE CULTURE: Culture: NO GROWTH

## 2020-05-12 ENCOUNTER — Other Ambulatory Visit: Payer: Self-pay | Admitting: Urology

## 2020-05-12 ENCOUNTER — Other Ambulatory Visit: Payer: Self-pay

## 2020-05-12 ENCOUNTER — Encounter (HOSPITAL_BASED_OUTPATIENT_CLINIC_OR_DEPARTMENT_OTHER): Payer: Self-pay | Admitting: Urology

## 2020-05-12 NOTE — Progress Notes (Addendum)
Spoke w/ via phone for pre-op interview---pt Lab needs dos----I stat (gent)               Lab results------none COVID test ------positive covid test 02-18-2020 results on chart city [place] at -------1215 pm 05-18-2020 NPO after MN NO Solid Food.  Clear liquids from MN until---1115 am then npo Med rec completed Medications to take morning of surgery -----rosuvastatin, pantaprazole, bupropion, tamsulosin Diabetic medication -----n/a Patient instructed to bring photo id and insurance card day of surgery Patient aware to have Driver (ride ) / caregiver finace crystal alexander will stay  for 24 hours after surgery  Patient Special Instructions -----bring cpap mask tubing and machine and leave in car Pre-Op special Istructions ----- Patient verbalized understanding of instructions that were given at this phone interview. Patient denies shortness of breath, chest pain, fever, cough at this phone interview.

## 2020-05-18 ENCOUNTER — Ambulatory Visit (HOSPITAL_BASED_OUTPATIENT_CLINIC_OR_DEPARTMENT_OTHER)
Admission: RE | Admit: 2020-05-18 | Discharge: 2020-05-18 | Disposition: A | Discharge status: Home / Self Care | Payer: 59 | Attending: Urology | Admitting: Urology

## 2020-05-18 ENCOUNTER — Encounter (HOSPITAL_BASED_OUTPATIENT_CLINIC_OR_DEPARTMENT_OTHER)
Admission: RE | Disposition: A | Discharge status: Home / Self Care | Payer: Self-pay | Source: Home / Self Care | Attending: Urology

## 2020-05-18 ENCOUNTER — Encounter (HOSPITAL_BASED_OUTPATIENT_CLINIC_OR_DEPARTMENT_OTHER): Payer: Self-pay | Admitting: Urology

## 2020-05-18 ENCOUNTER — Ambulatory Visit (HOSPITAL_BASED_OUTPATIENT_CLINIC_OR_DEPARTMENT_OTHER): Payer: 59 | Admitting: Anesthesiology

## 2020-05-18 DIAGNOSIS — Z833 Family history of diabetes mellitus: Secondary | ICD-10-CM | POA: Diagnosis not present

## 2020-05-18 DIAGNOSIS — Z87442 Personal history of urinary calculi: Secondary | ICD-10-CM | POA: Insufficient documentation

## 2020-05-18 DIAGNOSIS — Z791 Long term (current) use of non-steroidal anti-inflammatories (NSAID): Secondary | ICD-10-CM | POA: Insufficient documentation

## 2020-05-18 DIAGNOSIS — Z79899 Other long term (current) drug therapy: Secondary | ICD-10-CM | POA: Diagnosis not present

## 2020-05-18 DIAGNOSIS — Z8249 Family history of ischemic heart disease and other diseases of the circulatory system: Secondary | ICD-10-CM | POA: Insufficient documentation

## 2020-05-18 DIAGNOSIS — K219 Gastro-esophageal reflux disease without esophagitis: Secondary | ICD-10-CM | POA: Insufficient documentation

## 2020-05-18 DIAGNOSIS — F17200 Nicotine dependence, unspecified, uncomplicated: Secondary | ICD-10-CM | POA: Insufficient documentation

## 2020-05-18 DIAGNOSIS — N202 Calculus of kidney with calculus of ureter: Secondary | ICD-10-CM | POA: Diagnosis present

## 2020-05-18 HISTORY — PX: CYSTOSCOPY/URETEROSCOPY/HOLMIUM LASER/STENT PLACEMENT: SHX6546

## 2020-05-18 HISTORY — DX: Calculus of ureter: N20.1

## 2020-05-18 HISTORY — DX: Anxiety disorder, unspecified: F41.9

## 2020-05-18 HISTORY — DX: Personal history of urinary calculi: Z87.442

## 2020-05-18 HISTORY — DX: Hyperlipidemia, unspecified: E78.5

## 2020-05-18 LAB — POCT I-STAT, CHEM 8
BUN: 13 mg/dL (ref 6–20)
Calcium, Ion: 1.31 mmol/L (ref 1.15–1.40)
Chloride: 103 mmol/L (ref 98–111)
Creatinine, Ser: 1 mg/dL (ref 0.61–1.24)
Glucose, Bld: 97 mg/dL (ref 70–99)
HCT: 43 % (ref 39.0–52.0)
Hemoglobin: 14.6 g/dL (ref 13.0–17.0)
Potassium: 3.8 mmol/L (ref 3.5–5.1)
Sodium: 142 mmol/L (ref 135–145)
TCO2: 27 mmol/L (ref 22–32)

## 2020-05-18 SURGERY — CYSTOSCOPY/URETEROSCOPY/HOLMIUM LASER/STENT PLACEMENT
Anesthesia: General | Laterality: Bilateral

## 2020-05-18 MED ORDER — MIDAZOLAM HCL 2 MG/2ML IJ SOLN
INTRAMUSCULAR | Status: AC
  Filled 2020-05-18: qty 2

## 2020-05-18 MED ORDER — DEXAMETHASONE SODIUM PHOSPHATE 10 MG/ML IJ SOLN
INTRAMUSCULAR | Status: AC
  Filled 2020-05-18: qty 1

## 2020-05-18 MED ORDER — ACETAMINOPHEN 500 MG PO TABS
ORAL_TABLET | ORAL | Status: AC
  Filled 2020-05-18: qty 2

## 2020-05-18 MED ORDER — EPHEDRINE 5 MG/ML INJ
INTRAVENOUS | Status: AC
  Filled 2020-05-18: qty 10

## 2020-05-18 MED ORDER — LIDOCAINE 2% (20 MG/ML) 5 ML SYRINGE
INTRAMUSCULAR | Status: AC
  Filled 2020-05-18: qty 5

## 2020-05-18 MED ORDER — EPHEDRINE SULFATE 50 MG/ML IJ SOLN
INTRAMUSCULAR | Status: DC | PRN
  Administered 2020-05-18 (×2): 10 mg via INTRAVENOUS

## 2020-05-18 MED ORDER — OXYCODONE HCL 5 MG/5ML PO SOLN: 5.0000 mg | Freq: Once | ORAL | Status: DC | PRN

## 2020-05-18 MED ORDER — FENTANYL CITRATE (PF) 100 MCG/2ML IJ SOLN
INTRAMUSCULAR | Status: DC | PRN
  Administered 2020-05-18 (×4): 25 ug via INTRAVENOUS
  Administered 2020-05-18: 100 ug via INTRAVENOUS
  Administered 2020-05-18: 25 ug via INTRAVENOUS

## 2020-05-18 MED ORDER — FENTANYL CITRATE (PF) 100 MCG/2ML IJ SOLN
INTRAMUSCULAR | Status: AC
  Filled 2020-05-18: qty 2

## 2020-05-18 MED ORDER — PHENAZOPYRIDINE HCL 200 MG PO TABS: 200.0000 mg | ORAL_TABLET | Freq: Three times a day (TID) | ORAL | 0 refills | Status: AC | PRN

## 2020-05-18 MED ORDER — GLYCOPYRROLATE 0.2 MG/ML IJ SOLN
INTRAMUSCULAR | Status: DC | PRN
  Administered 2020-05-18: .2 mg via INTRAVENOUS

## 2020-05-18 MED ORDER — MEPERIDINE HCL 25 MG/ML IJ SOLN: 6.2500 mg | INTRAMUSCULAR | Status: DC | PRN

## 2020-05-18 MED ORDER — KETOROLAC TROMETHAMINE 30 MG/ML IJ SOLN
INTRAMUSCULAR | Status: AC
  Filled 2020-05-18: qty 1

## 2020-05-18 MED ORDER — ONDANSETRON HCL 4 MG/2ML IJ SOLN
INTRAMUSCULAR | Status: DC | PRN
  Administered 2020-05-18: 4 mg via INTRAVENOUS

## 2020-05-18 MED ORDER — LACTATED RINGERS IV SOLN: INTRAVENOUS | Status: DC

## 2020-05-18 MED ORDER — GLYCOPYRROLATE PF 0.2 MG/ML IJ SOSY
PREFILLED_SYRINGE | INTRAMUSCULAR | Status: AC
  Filled 2020-05-18: qty 1

## 2020-05-18 MED ORDER — BELLADONNA ALKALOIDS-OPIUM 16.2-60 MG RE SUPP
RECTAL | Status: AC
  Filled 2020-05-18: qty 1

## 2020-05-18 MED ORDER — BELLADONNA ALKALOIDS-OPIUM 16.2-60 MG RE SUPP
RECTAL | Status: DC | PRN
  Administered 2020-05-18: 1 via RECTAL

## 2020-05-18 MED ORDER — PROMETHAZINE HCL 25 MG/ML IJ SOLN: 6.2500 mg | INTRAMUSCULAR | Status: DC | PRN

## 2020-05-18 MED ORDER — SODIUM CHLORIDE 0.9 % IR SOLN
Status: DC | PRN
  Administered 2020-05-18 (×2): 3000 mL

## 2020-05-18 MED ORDER — KETOROLAC TROMETHAMINE 30 MG/ML IJ SOLN: 30.0000 mg | Freq: Once | INTRAMUSCULAR | Status: DC | PRN

## 2020-05-18 MED ORDER — ACETAMINOPHEN 500 MG PO TABS
1000.0000 mg | ORAL_TABLET | Freq: Once | ORAL | Status: AC
  Administered 2020-05-18: 1000 mg via ORAL

## 2020-05-18 MED ORDER — IOHEXOL 300 MG/ML  SOLN
INTRAMUSCULAR | Status: DC | PRN
  Administered 2020-05-18: 20 mL via URETHRAL

## 2020-05-18 MED ORDER — LIDOCAINE 2% (20 MG/ML) 5 ML SYRINGE
INTRAMUSCULAR | Status: DC | PRN
  Administered 2020-05-18: 60 mg via INTRAVENOUS

## 2020-05-18 MED ORDER — DEXAMETHASONE SODIUM PHOSPHATE 10 MG/ML IJ SOLN
INTRAMUSCULAR | Status: DC | PRN
  Administered 2020-05-18: 10 mg via INTRAVENOUS

## 2020-05-18 MED ORDER — PROPOFOL 10 MG/ML IV BOLUS
INTRAVENOUS | Status: DC | PRN
  Administered 2020-05-18 (×2): 100 mg via INTRAVENOUS
  Administered 2020-05-18: 200 mg via INTRAVENOUS

## 2020-05-18 MED ORDER — OXYCODONE HCL 5 MG PO TABS
ORAL_TABLET | ORAL | Status: AC
  Filled 2020-05-18: qty 1

## 2020-05-18 MED ORDER — CEPHALEXIN 500 MG PO CAPS: 500.0000 mg | ORAL_CAPSULE | Freq: Two times a day (BID) | ORAL | 0 refills | Status: AC

## 2020-05-18 MED ORDER — HYDROMORPHONE HCL 1 MG/ML IJ SOLN: 0.2500 mg | INTRAMUSCULAR | Status: DC | PRN

## 2020-05-18 MED ORDER — KETOROLAC TROMETHAMINE 30 MG/ML IJ SOLN
INTRAMUSCULAR | Status: DC | PRN
  Administered 2020-05-18: 30 mg via INTRAVENOUS

## 2020-05-18 MED ORDER — MIDAZOLAM HCL 2 MG/2ML IJ SOLN
INTRAMUSCULAR | Status: DC | PRN
  Administered 2020-05-18: 2 mg via INTRAVENOUS

## 2020-05-18 MED ORDER — PROPOFOL 10 MG/ML IV BOLUS
INTRAVENOUS | Status: AC
  Filled 2020-05-18: qty 40

## 2020-05-18 MED ORDER — OXYCODONE-ACETAMINOPHEN 5-325 MG PO TABS: 1.0000 | ORAL_TABLET | ORAL | 0 refills | Status: DC | PRN

## 2020-05-18 MED ORDER — GENTAMICIN SULFATE 40 MG/ML IJ SOLN
5.0000 mg/kg | Freq: Once | INTRAVENOUS | Status: AC
  Administered 2020-05-18: 425.6 mg via INTRAVENOUS
  Filled 2020-05-18: qty 10.75

## 2020-05-18 MED ORDER — OXYCODONE HCL 5 MG PO TABS: 5.0000 mg | ORAL_TABLET | Freq: Once | ORAL | Status: DC | PRN

## 2020-05-18 MED ORDER — ONDANSETRON HCL 4 MG/2ML IJ SOLN
INTRAMUSCULAR | Status: AC
  Filled 2020-05-18: qty 2

## 2020-05-18 SURGICAL SUPPLY — 27 items
BAG DRAIN URO-CYSTO SKYTR STRL (DRAIN) ×2 IMPLANT
BAG DRN UROCATH (DRAIN) ×1
BASKET STONE 1.7 NGAGE (UROLOGICAL SUPPLIES) ×1 IMPLANT
BASKET ZERO TIP NITINOL 2.4FR (BASKET) ×2 IMPLANT
BSKT STON RTRVL ZERO TP 2.4FR (BASKET) ×1
CATH URET 5FR 28IN OPEN ENDED (CATHETERS) ×1 IMPLANT
CLOTH BEACON ORANGE TIMEOUT ST (SAFETY) ×2 IMPLANT
GLOVE SURG ENC MOIS LTX SZ7.5 (GLOVE) ×2 IMPLANT
GLOVE SURG UNDER POLY LF SZ6.5 (GLOVE) ×1 IMPLANT
GLOVE SURG UNDER POLY LF SZ7 (GLOVE) ×1 IMPLANT
GLOVE SURG UNDER POLY LF SZ7.5 (GLOVE) ×3 IMPLANT
GOWN STRL REUS W/TWL LRG LVL3 (GOWN DISPOSABLE) ×3 IMPLANT
GOWN STRL REUS W/TWL XL LVL3 (GOWN DISPOSABLE) ×2 IMPLANT
GUIDEWIRE STR DUAL SENSOR (WIRE) ×1 IMPLANT
GUIDEWIRE ZIPWRE .038 STRAIGHT (WIRE) ×2 IMPLANT
IV NS IRRIG 3000ML ARTHROMATIC (IV SOLUTION) ×3 IMPLANT
KIT TURNOVER CYSTO (KITS) ×2 IMPLANT
MANIFOLD NEPTUNE II (INSTRUMENTS) ×2 IMPLANT
NS IRRIG 500ML POUR BTL (IV SOLUTION) ×2 IMPLANT
PACK CYSTO (CUSTOM PROCEDURE TRAY) ×2 IMPLANT
SHEATH URETERAL 12FRX35CM (MISCELLANEOUS) ×1 IMPLANT
STENT URET 6FRX24 CONTOUR (STENTS) ×2 IMPLANT
SYR 10ML LL (SYRINGE) ×2 IMPLANT
TRACTIP FLEXIVA PULS ID 200XHI (Laser) IMPLANT
TRACTIP FLEXIVA PULSE ID 200 (Laser) ×4
TUBE CONNECTING 12X1/4 (SUCTIONS) ×1 IMPLANT
TUBING UROLOGY SET (TUBING) ×2 IMPLANT

## 2020-05-18 NOTE — Anesthesia Postprocedure Evaluation (Signed)
Anesthesia Post Note  Patient: Michael [name]  Procedure(s) Performed: CYSTOSCOPY/RETROGRADE/URETEROSCOPY/HOLMIUM LASER/STENT PLACEMENT (Bilateral )     Patient location during evaluation: PACU Anesthesia Type: General Level of consciousness: awake and alert and oriented Pain management: pain level controlled Vital Signs Assessment: post-procedure vital signs reviewed and stable Respiratory status: spontaneous breathing, nonlabored ventilation and respiratory function stable Cardiovascular status: blood pressure returned to baseline and stable Postop Assessment: no apparent nausea or vomiting Anesthetic complications: no   No complications documented.  Last Vitals:  Vitals:   05/18/20 1630 05/18/20 1707  BP: 132/85 (!) 140/96  Pulse: 93 99  Resp: 12 18  Temp: 36.6 C (!) 36.2 C  SpO2: 92% 93%    Last Pain:  Vitals:   05/18/20 1648  TempSrc:   PainSc: 8                  Myrle Dues A.

## 2020-05-18 NOTE — Anesthesia Procedure Notes (Signed)
Procedure Name: LMA Insertion Date/Time: 05/18/2020 1:57 PM Performed by: Justice Rocher, CRNA Pre-anesthesia Checklist: Patient identified, Emergency Drugs available, Suction available, Patient being monitored and Timeout performed Patient Re-evaluated:Patient Re-evaluated prior to induction Oxygen Delivery Method: Circle system utilized Preoxygenation: Pre-oxygenation with 100% oxygen Induction Type: IV induction Ventilation: Mask ventilation without difficulty LMA: LMA inserted LMA Size: 4.0 Number of attempts: 1 Airway Equipment and Method: Bite block Placement Confirmation: positive ETCO2,  breath sounds checked- equal and bilateral and CO2 detector Tube secured with: Tape Dental Injury: Teeth and Oropharynx as per pre-operative assessment

## 2020-05-18 NOTE — H&P (Signed)
PRE-OP H&P  Office Visit Report     05/10/2020   --------------------------------------------------------------------------------   Michael [name]  MRN: [id number]  DOB: 1975-02-05, 45 year old Male    PRIMARY CARE:  Merrilee Seashore, MD  REFERRING:  Merrilee Seashore, MD  PROVIDER:  Link Snuffer, Shirley Friar, M.D.  TREATING:  Ellison Hughs, M.D.  LOCATION:  [facility] [phone]     --------------------------------------------------------------------------------   CC: I have pain in the flank.  HPI: Michael [name] is a 45 year-old male established patient who is here for flank pain.  The problem is on the right side. His pain started about approximately 04/26/2020. The pain is sharp. The intensity of his pain is rated as a 10. The pain is intermittent. The pain does radiate.   None< makes the pain better. Nothing causes the pain to become worse. He was treated with the following pain medication(s): Hydrocodone and Ibuprofen.   He has had this same pain previously. He has had kidney stones.   Michael [name] is a 45 year old male with a history of kidney stones.   05/10/2020: The patient presents back to the office today with worsening right-sided flank pain that is associated with nausea, but no vomiting. He denies interval fever/chills, dysuria or gross hematuria. He is visibly uncomfortable in clinic today. He states that he is taking 600-800 mg of ibuprofen with really little relief in his pain. He is also taking Norco 5/325 mg with little improvement.   KUB from his office visit on 04/29/2020 revealed bilateral calcifications within each renal shadow, but no definitive right proximal ureteral calcification     ALLERGIES: No Allergies    MEDICATIONS: Bupropion Hcl Sr 150 mg tablet,sustained-release 12 hr  Ibuprofen 1 PO Daily  Pantoprazole Sodium 20 mg tablet, delayed release  Rosuvastatin Calcium     GU PSH: ESWL - 2009       PSH Notes: Lithotripsy, Wrist Surgery    NON-GU PSH: Hernia Repair     GU PMH: Renal calculus, Bilateral - 04/29/2020, Bilateral, - 11/20/2018, - 05/22/2018 (Worsening), He has bilateral renal stones. I gave him stone prevention handouts and he will need a metabolic w/u with this episode has resolved. , - 2020, Nephrolithiasis, - 2016 Renal and ureteral calculus - 04/15/2020 Ureteral calculus, Clinically he has a left ureteral stone but he could have recently passed one and have residual symptoms with urgency and some discomfort. I am going to give him tamsulosin and have him return in 2 weeks. If he is unimproved he will need a CT. - 05/28/2019, - 05/22/2018, I discussed management options for his stones including MET, URS and ESWL. He will try to pass the stone. I have refilled the hydrocodone and he will return in 2 weeks for a KUB. , - 2020, Calculus of ureter, - 2014 Urinary Urgency - 05/28/2019 Gross hematuria, Gross hematuria - 2016 LLQ pain, Abdominal pain, LLQ (left lower quadrant) - 8295 Renal colic, Renal colic - 6213      PMH Notes:  2006-05-14 15:38:59 - Note: Urinary Calculus   NON-GU PMH: Personal history of other endocrine, nutritional and metabolic disease, History of hypercholesterolemia - 2014 GERD Hypercholesterolemia    FAMILY HISTORY: Diabetes - Runs In Family Heart Disease - Runs In Family nephrolithiasis - Runs In Family    Notes: Mother still living at age 55  father still living at age 53  Has 4 sons   SOCIAL HISTORY: Marital Status: Single Current Smoking Status: Patient smokes. Has smoked since 04/23/2008.  Smokes 1/2 pack per day.   Tobacco Use Assessment Completed: Used Tobacco in last 30 days? Does drink.  Drinks 1 caffeinated drink per day.     Notes: Former smoker, Father's age, Single, Former cigarette smoker, Mother's age, Three children, Alcohol Use, Caffeine Use, Occupation:, Tobacco Use   REVIEW OF SYSTEMS:    GU Review Male:   Patient denies frequent urination, hard to postpone  urination, burning/ pain with urination, get up at night to urinate, leakage of urine, stream starts and stops, trouble starting your stream, have to strain to urinate , erection problems, and penile pain.  Gastrointestinal (Upper):   Patient denies nausea, vomiting, and indigestion/ heartburn.  Gastrointestinal (Lower):   Patient denies diarrhea and constipation.  Constitutional:   Patient denies fever, night sweats, weight loss, and fatigue.  Skin:   Patient denies skin rash/ lesion and itching.  Eyes:   Patient denies blurred vision and double vision.  Ears/ Nose/ Throat:   Patient denies sore throat and sinus problems.  Hematologic/Lymphatic:   Patient denies swollen glands and easy bruising.  Cardiovascular:   Patient denies leg swelling and chest pains.  Respiratory:   Patient denies cough and shortness of breath.  Endocrine:   Patient denies excessive thirst.  Musculoskeletal:   Patient reports back pain. Patient denies joint pain.  Neurological:   Patient denies headaches and dizziness.  Psychologic:   Patient denies depression and anxiety.   VITAL SIGNS:      05/10/2020 02:30 PM  Weight 220 lb / 99.79 kg  BP 192/84 mmHg  Pulse 76 /min  Temperature 97.9 F / 36.6 C   Complexity of Data:  Records Review:   Previous Patient Records  X-Ray Review: KUB: Reviewed Films. Reviewed Report. Discussed With Patient.  C.T. Abdomen/Pelvis: Reviewed Films. Reviewed Report. Discussed With Patient.    Notes:                     CLINICAL DATA: Left flank pain for 2 hours. History of kidney  stones.   EXAM:  CT ABDOMEN AND PELVIS WITHOUT CONTRAST   TECHNIQUE:  Multidetector CT imaging of the abdomen and pelvis was performed  following the standard protocol without IV contrast.   COMPARISON: CT abdomen and pelvis 05/06/2018.   FINDINGS:  Lower chest: Lung bases clear. No pleural or pericardial effusion.   Hepatobiliary: No focal liver abnormality is seen. No gallstones,  gallbladder  wall thickening, or biliary dilatation.   Pancreas: Unremarkable. No pancreatic ductal dilatation or  surrounding inflammatory changes.   Spleen: Normal in size without focal abnormality.   Adrenals/Urinary Tract: The adrenal glands appear normal. The  patient has multiple nonobstructing bilateral renal stones. One of  the largest on the right is in the upper pole and measures 0.8 cm  and the largest on the left is in the midpole and measures 1 cm.  There is no hydronephrosis on the right or left but the patient does  have a 0.5 cm right ureteral stone at approximately the level of the  superior endplate of L4. Urinary bladder is negative.   Stomach/Bowel: Stomach is within normal limits. Appendix appears  normal. No evidence of bowel wall thickening, distention, or  inflammatory changes.   Vascular/Lymphatic: No significant vascular findings are present. No  enlarged abdominal or pelvic lymph nodes.   Reproductive: Prostate is unremarkable.   Other: None.   Musculoskeletal: No acute or focal abnormality.   IMPRESSION:  Multiple bilateral nonobstructing renal stones. The  patient does  have a 0.5 cm stone in the right ureter approximately at the level  of the superior endplate of L4. There is no hydronephrosis on the  right or left. Negative for left ureteral stone.    Electronically Signed  By: Inge Rise M.D.  On: 04/14/2020 15:26   PROCEDURES:          Ketoralac 75m - [id number], [id number]: 30 Adm. By: PPricilla Riffle Unit: mg Lot No [id number] Route: IM Exp. Date 08/23/2021  Freq: None Mfgr.:   Site: Right Hip   ASSESSMENT:      ICD-10 Details  1 GU:   Flank Pain - R10.84 Right, Acute, Uncomplicated  2   Renal and ureteral calculus - N20.2 Bilateral, Chronic, Stable     PLAN:            Medications New Meds: Percocet 7.5 mg-325 mg tablet 1-2 tablet PO Q 4 H PRN   #20  0 Refill(s)            Schedule Return Visit/Planned Activity: ASAP - Schedule  Surgery          Document Letter(s):  Created for Patient: Clinical Summary         Notes:   The risks, benefits and alternatives of cystoscopy with BILATERAL ureteroscopy, laser lithotripsy and ureteral stent placement was discussed the patient. Risks included, but are not limited to: bleeding, urinary tract infection, ureteral injury/avulsion, ureteral stricture formation, retained stone fragments, the possibility that multiple surgeries may be required to treat the stone(s), MI, stroke, PE and the inherent risks of general anesthesia. The patient voices understanding and wishes to proceed.   We discussed criteria to return to clinic or proceed to the ER which include: Fever/chills, worsening pain, nausea/vomiting and/or persistent gross hematuria.

## 2020-05-18 NOTE — Transfer of Care (Signed)
Immediate Anesthesia Transfer of Care Note  Patient: Michael [name]  Procedure(s) Performed: CYSTOSCOPY/RETROGRADE/URETEROSCOPY/HOLMIUM LASER/STENT PLACEMENT (Bilateral )  Patient Location: PACU  Anesthesia Type:General  Level of Consciousness: drowsy  Airway & Oxygen Therapy: Patient Spontanous Breathing and Patient connected to nasal cannula oxygen  Post-op Assessment: Report given to RN  Post vital signs: Reviewed and stable  Last Vitals:  Vitals Value Taken Time  BP 134/92 05/18/20 1556  Temp 36.7 C 05/18/20 1556  Pulse 88 05/18/20 1558  Resp 10 05/18/20 1558  SpO2 93 % 05/18/20 1558  Vitals shown include unvalidated device data.  Last Pain:  Vitals:   05/18/20 1231  TempSrc: Oral  PainSc: 4       Patients Stated Pain Goal: 4 (21/11/73 5670)  Complications: No complications documented.

## 2020-05-18 NOTE — Anesthesia Preprocedure Evaluation (Addendum)
Anesthesia Evaluation  Patient identified by MRN, date of birth, ID band Patient awake    Reviewed: Allergy & Precautions, NPO status , Patient's Chart, lab work & pertinent test results  Airway Mallampati: II  TM Distance: >3 FB Neck ROM: Full    Dental no notable dental hx. (+) Dental Advisory Given   Pulmonary former smoker,  Quit smoking 02/2020, 8 pack year history  COVID 01/2020   Pulmonary exam normal breath sounds clear to auscultation       Cardiovascular negative cardio ROS Normal cardiovascular exam Rhythm:Regular Rate:Normal     Neuro/Psych PSYCHIATRIC DISORDERS Anxiety negative neurological ROS     GI/Hepatic Neg liver ROS, GERD  Medicated and Controlled,  Endo/Other  negative endocrine ROS  Renal/GU Renal disease (right ureteral stone )  negative genitourinary   Musculoskeletal negative musculoskeletal ROS (+)   Abdominal   Peds  Hematology negative hematology ROS (+)   Anesthesia Other Findings   Reproductive/Obstetrics negative OB ROS                            Anesthesia Physical Anesthesia Plan  ASA: I  Anesthesia Plan: General   Post-op Pain Management:    Induction: Intravenous  PONV Risk Score and Plan: Ondansetron, Dexamethasone, Midazolam and Treatment may vary due to age or medical condition  Airway Management Planned: LMA  Additional Equipment: None  Intra-op Plan:   Post-operative Plan: Extubation in OR  Informed Consent: I have reviewed the patients History and Physical, chart, labs and discussed the procedure including the risks, benefits and alternatives for the proposed anesthesia with the patient or authorized representative who has indicated his/her understanding and acceptance.     Dental advisory given  Plan Discussed with: CRNA  Anesthesia Plan Comments:         Anesthesia Quick Evaluation

## 2020-05-18 NOTE — Discharge Instructions (Signed)
[facility] [phone] Post Ureteroscopy With or Without Stent Instructions  Definitions:  Ureter: The duct that transports urine from the kidney to the bladder. Stent:   A plastic hollow tube that is placed into the ureter, from the kidney to the bladder to prevent the ureter from swelling shut.  GENERAL INSTRUCTIONS:  Despite the fact that no skin incisions were used, the area around the ureter and bladder is raw and irritated. The stent is a foreign body which will further irritate the bladder wall. This irritation is manifested by increased frequency of urination, both day and night, and by an increase in the urge to urinate. In some, the urge to urinate is present almost always. Sometimes the urge is strong enough that you may not be able to stop yourself from urinating. The only real cure is to remove the stent and then give time for the bladder wall to heal which can't be done until the danger of the ureter swelling shut has passed, which varies.  You may see some blood in your urine while the stent is in place and a few days afterwards. Do not be alarmed, even if the urine was clear for a while. Get off your feet and drink lots of fluids until clearing occurs. If you start to pass clots or don't improve, call us.  DIET: You may return to your normal diet immediately. Because of the raw surface of your bladder, alcohol, spicy foods, acid type foods and drinks with caffeine may cause irritation or frequency and should be used in moderation. To keep your urine flowing freely and to avoid constipation, drink plenty of fluids during the day ( 8-10 glasses ). Tip: Avoid cranberry juice because it is very acidic.  ACTIVITY: Your physical activity doesn't need to be restricted. However, if you are very active, you may see some blood in your urine. We suggest that you reduce your activity under these circumstances until the bleeding has stopped.  BOWELS: It is important to  keep your bowels regular during the postoperative period. Straining with bowel movements can cause bleeding. A bowel movement every other day is reasonable. Use a mild laxative if needed, such as Milk of Magnesia 2-3 tablespoons, or 2 Dulcolax tablets. Call if you continue to have problems. If you have been taking narcotics for pain, before, during or after your surgery, you may be constipated. Take a laxative if necessary.   MEDICATION: You should resume your pre-surgery medications unless told not to. In addition you will often be given an antibiotic to prevent infection. These should be taken as prescribed until the bottles are finished unless you are having an unusual reaction to one of the drugs.  PROBLEMS YOU SHOULD REPORT TO [place]:  Fevers over 100.5 Fahrenheit.  Heavy bleeding, or clots ( See above notes about blood in urine ).  Inability to urinate.  Drug reactions ( hives, rash, nausea, vomiting, diarrhea ).  Severe burning or pain with urination that is not improving.  FOLLOW-UP: You will need a follow-up appointment to monitor your progress. Call for this appointment at the number listed above. Usually the first appointment will be about three to fourteen days after your surgery.   Post Anesthesia Home Care Instructions  Activity: Get plenty of rest for the remainder of the day. A responsible individual must stay with you for 24 hours following the procedure.  For the next 24 hours, DO NOT: -Drive a car -Paediatric nurse -Drink alcoholic beverages -Take any medication unless  instructed by your physician -Make any legal decisions or sign important papers.  Meals: Start with liquid foods such as gelatin or soup. Progress to regular foods as tolerated. Avoid greasy, spicy, heavy foods. If nausea and/or vomiting occur, drink only clear liquids until the nausea and/or vomiting subsides. Call your physician if vomiting continues.  Special Instructions/Symptoms: Your throat  may feel dry or sore from the anesthesia or the breathing tube placed in your throat during surgery. If this causes discomfort, gargle with warm salt water. The discomfort should disappear within 24 hours.  No ibuprofen, Advil, Aleve, Motrin, or naproxen until after 9:15 pm today if needed.

## 2020-05-18 NOTE — Op Note (Addendum)
Operative Note  Preoperative diagnosis:  1.  Obstructing 5 mm right mid ureteral calculus 2.  Bilateral nonobstructing renal stones measuring 5 to 10 mm  Postoperative diagnosis: Same  Procedure(s): 1.  Cystoscopy with bilateral ureteroscopy, bilateral holmium laser lithotripsy and bilateral JJ stent placement 2.  Bilateral retrograde pyelograms with intraoperative interpretation of fluoroscopic imaging  Surgeon: Ellison Hughs, MD  Assistants:  None  Anesthesia:  General  Complications:  None  EBL: Less than 5 mL  Specimens: 1.  Right ureteral stone fragments  Drains/Catheters: 1.  Bilateral 6 French, 24 cm JJ stents without tethers  Intraoperative findings:   1. Right retrograde pyelogram revealed a filling defect within the midportion of the right ureter, consistent with the obstructing stone seen on recent CT.  There was proximal dilation of the right ureter and renal pelvis with no other obvious filling defects. 2. Left retrograde pyelogram revealed a normal caliber left ureter with no filling defects.  The left renal pelvis exhibited multiple filling defects, consistent with the nonobstructing stone seen on recent imaging. 3. Numerous bilateral renal stones measuring 5-10 mm  Indication:  Michael [name] is a 45 y.o. male with right renal colic secondary to an obstructing right mid ureteral calculus.  He also has multiple, bilateral 5 to 10 mm nonobstructing renal stones.  Has been consented for the above procedures, voices understanding wishes to proceed.  Description of procedure:  After informed consent was obtained, the patient was brought to the operating room and general LMA anesthesia was administered. The patient was then placed in the dorsolithotomy position and prepped and draped in the usual sterile fashion. A timeout was performed. A 23 French rigid cystoscope was then inserted into the urethral meatus and advanced into the bladder under direct  vision. A complete bladder survey revealed no intravesical pathology.  A 5 French ureteral catheter was then inserted into the right ureteral orifice and a retrograde pyelogram was obtained, with the findings listed above.  A Glidewire was then used to intubate the lumen of the ureteral catheter and was advanced up to the right renal pelvis, under fluoroscopic guidance.  The catheter was then removed, leaving the wire in place.  A semirigid ureteroscope was then advanced alongside the wire and into the right ureter.  His obstructing stone was identified in the midportion of the right ureter.  A 200 m holmium laser was then used to fracture the stone into numerous smaller pieces that were then extracted using a tipless basket.  A sensor wire was then advanced up the right ureter to the right renal pelvis, fluoroscopic guidance.  A ureteral access sheath was then advanced over the sensor wire and into position within the proximal aspects of the right ureter, confirming placement via fluoroscopy.  A flexible ureteroscope was then advanced through the ureteral access sheath and into the right renal pelvis.  A complete inspection of the right renal pelvis revealed multiple 5 to 10 mm renal stones.  A 200 m holmium laser was then used to dust the stones into numerous smaller fragments no larger than 2 mm.  The flexible ureteroscope was then removed under direct vision, identifying no evidence of ureteral trauma.  A 6 French, 24 cm JJ stent was then advanced over the Glidewire and into good position within the right collecting system, confirming placement via fluoroscopy.  A 5 French ureteral catheter was then inserted into the left ureteral orifice and a retrograde pyelogram was obtained, with the findings listed above.  A Glidewire was then used to intubate the lumen of the ureteral catheter and was advanced up to the left renal pelvis, under fluoroscopic guidance.  The catheter was then removed, leaving the  wire in place.    I attempted to place the inner cannula of the ureteral access sheath, but met resistance at the level of the left iliac vasculature.  I then made a decision to advance the flexible ureteroscope over the Glidewire and up to the left renal pelvis.  A complete inspection of the left renal pelvis revealed numerous 5 to 10 mm nonobstructing calculi.  The 200 m holmium laser was then used to dust the stone into numerous smaller fragments, no larger than 2 mm.  The flexible ureteroscope was then removed under direct vision, revealing no evidence of left ureteral trauma.  A 6 French, 24 cm JJ stent was then advanced over the Glidewire and into good position within the left collecting system, confirming placement via fluoroscopy.  The patient's bladder was drained and all stone fragments were removed.  He tolerated the procedure well and was transferred to the postanesthesia in stable condition.  Plan: Follow-up on 05/25/2020 for office cystoscopy and bilateral JJ stent removal

## 2020-05-21 ENCOUNTER — Encounter (HOSPITAL_BASED_OUTPATIENT_CLINIC_OR_DEPARTMENT_OTHER): Payer: Self-pay | Admitting: Urology

## 2021-10-12 ENCOUNTER — Emergency Department (HOSPITAL_BASED_OUTPATIENT_CLINIC_OR_DEPARTMENT_OTHER): Payer: 59

## 2021-10-12 ENCOUNTER — Other Ambulatory Visit: Payer: Self-pay

## 2021-10-12 ENCOUNTER — Encounter (HOSPITAL_BASED_OUTPATIENT_CLINIC_OR_DEPARTMENT_OTHER): Payer: Self-pay

## 2021-10-12 ENCOUNTER — Emergency Department (HOSPITAL_BASED_OUTPATIENT_CLINIC_OR_DEPARTMENT_OTHER)
Admission: EM | Admit: 2021-10-12 | Discharge: 2021-10-12 | Disposition: A | Discharge status: Home / Self Care | Payer: 59 | Attending: Emergency Medicine | Admitting: Emergency Medicine

## 2021-10-12 ENCOUNTER — Ambulatory Visit (HOSPITAL_COMMUNITY)
Admission: EM | Admit: 2021-10-12 | Discharge: 2021-10-12 | Disposition: A | Discharge status: Home / Self Care | Payer: 59

## 2021-10-12 DIAGNOSIS — R519 Headache, unspecified: Secondary | ICD-10-CM | POA: Diagnosis present

## 2021-10-12 DIAGNOSIS — S93401A Sprain of unspecified ligament of right ankle, initial encounter: Secondary | ICD-10-CM | POA: Insufficient documentation

## 2021-10-12 DIAGNOSIS — S060X9A Concussion with loss of consciousness of unspecified duration, initial encounter: Secondary | ICD-10-CM | POA: Diagnosis not present

## 2021-10-12 DIAGNOSIS — Y92410 Unspecified street and highway as the place of occurrence of the external cause: Secondary | ICD-10-CM | POA: Insufficient documentation

## 2021-10-12 NOTE — ED Notes (Signed)
Patient is being discharged from the Urgent Care and sent to the Emergency Department via pov . Per Dr Mannie Stabile, patient is in need of higher level of care due to limited resources. Patient is aware and verbalizes understanding of plan of care.  Vitals:   10/12/21 1157  BP: (!) 154/101  Pulse: 73  Resp: 18  Temp: 97.6 F (36.4 C)  SpO2: 96%

## 2021-10-12 NOTE — ED Notes (Signed)
Dr Mannie Stabile reports patient elects emergency department evaluation after speaking with provider.
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
## 2021-10-12 NOTE — ED Notes (Signed)
Dr hagler notified and in to see patient

## 2021-10-12 NOTE — ED Notes (Signed)
RN provided AVS using Teachback Method. Patient verbalizes understanding of Discharge Instructions. Opportunity for Questioning and Answers were provided by RN. Patient Discharged from ED ambulatory to Home via Self.  
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
## 2021-10-12 NOTE — Discharge Instructions (Addendum)
Your exam today was overall reassuring.  CT scan of the head and neck did not show any concerning findings.  Right ankle x-ray did not show any fracture.  You likely have an ankle sprain.  You received a walking boot in the emergency room.  Continue icing this area for about 15 minutes every 4 hours, continue taking ibuprofen as you need to for pain control.  Follow-up with your PCP.  If any concerning symptoms return to the emergency room for evaluation.  I have attached information regarding a concussion clinic above for you.  For prolonged symptoms please call and follow-up with them.
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
## 2022-11-09 IMAGING — CT CT RENAL STONE PROTOCOL
2 of 4 series · 16 of 46 positions shown, 18 images · non-contrast
Comparison: CT abdomen and pelvis 05/06/2018.

CLINICAL DATA: Left flank pain for 2 hours. History of kidney
stones.

EXAM:
CT ABDOMEN AND PELVIS WITHOUT CONTRAST
TECHNIQUE: Multidetector CT imaging of the abdomen and pelvis was performed
following the standard protocol without IV contrast.

[Series 3: stone study 5.0 i30f 2 · axial · 0.81mm/px · z∈[+621,+1076]mm · 13 of 101 slices shown, 15 images]
[im 5/101  soft-tissue]
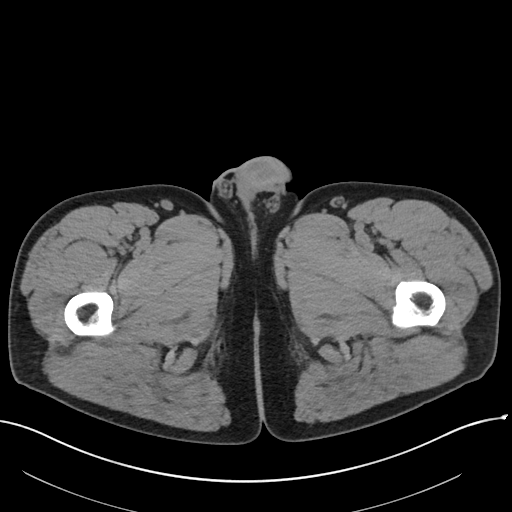
[im 5/101  bone]
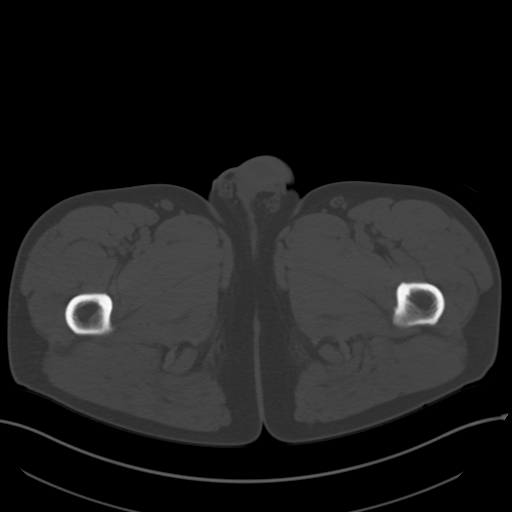
[im 14/101  soft-tissue]
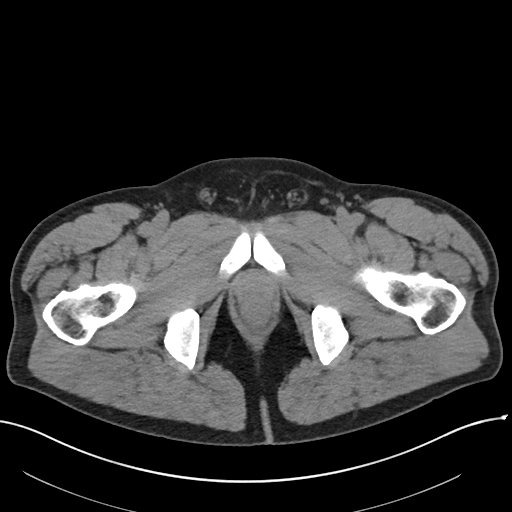
[im 22/101  soft-tissue]
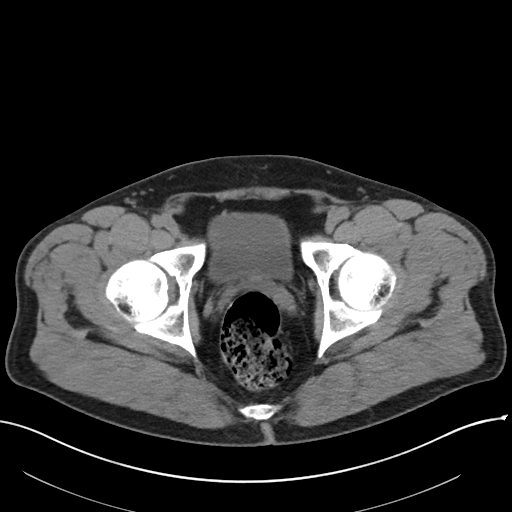
[im 27/101  soft-tissue]
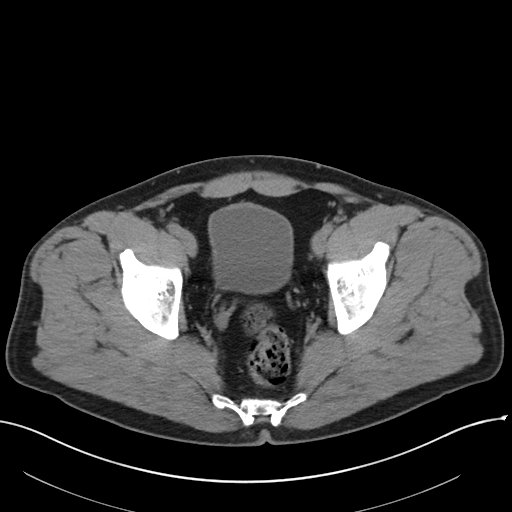
[im 35/101  soft-tissue]
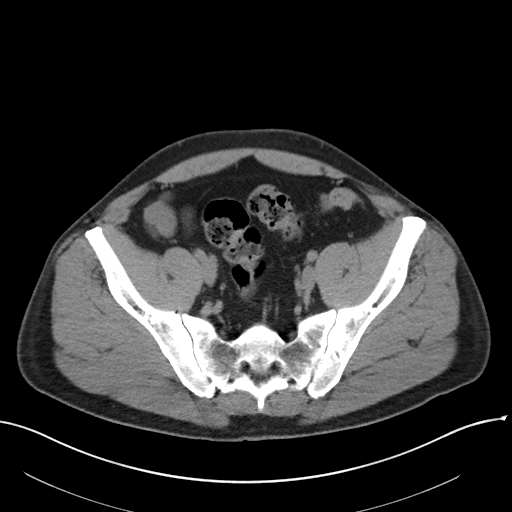
[im 44/101  soft-tissue]
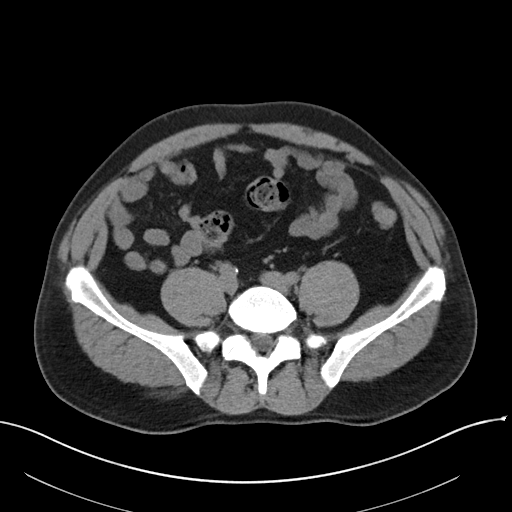
[im 53/101  soft-tissue]
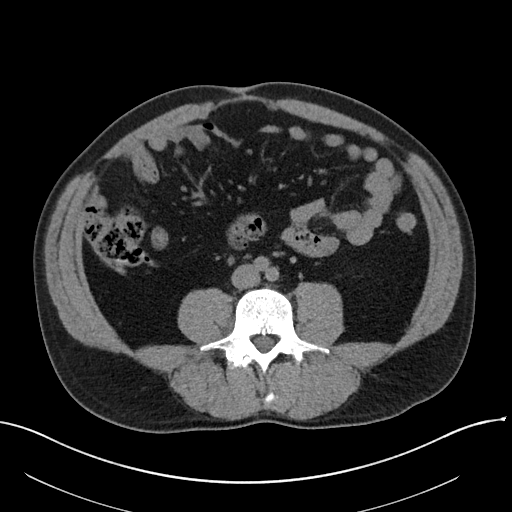
[im 57/101  soft-tissue]
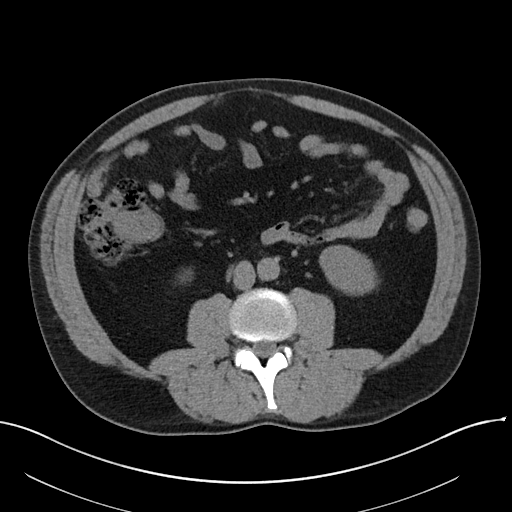
[im 66/101  soft-tissue]
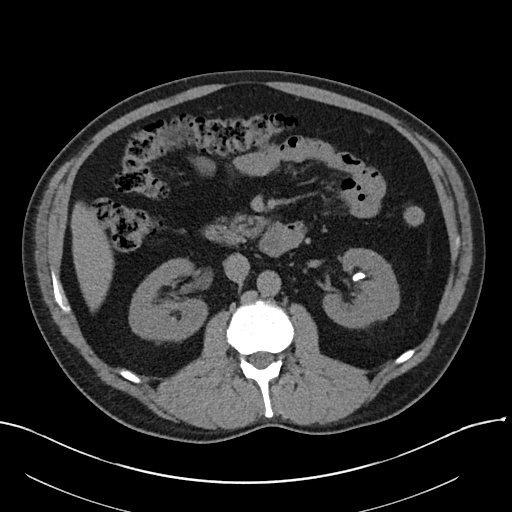
[im 66/101  bone]
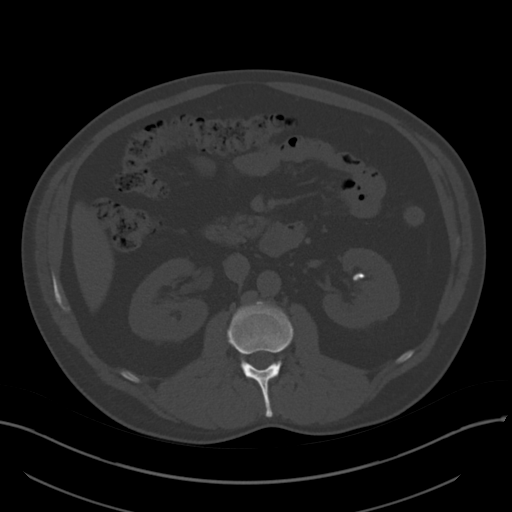
[im 74/101  soft-tissue]
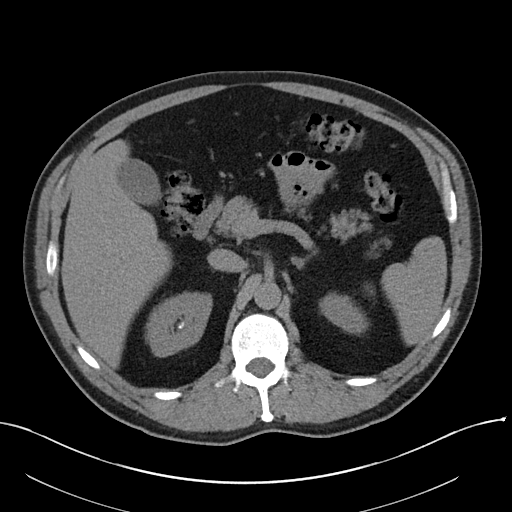
[im 79/101  soft-tissue]
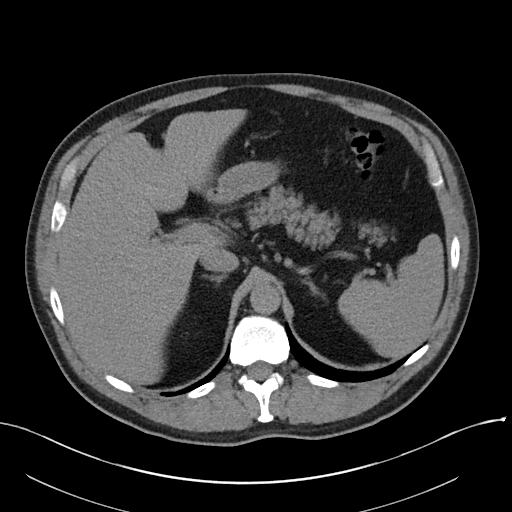
[im 87/101  soft-tissue]
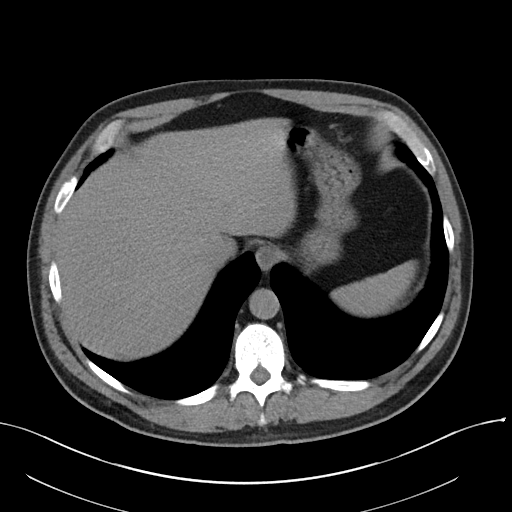
[im 96/101  soft-tissue]
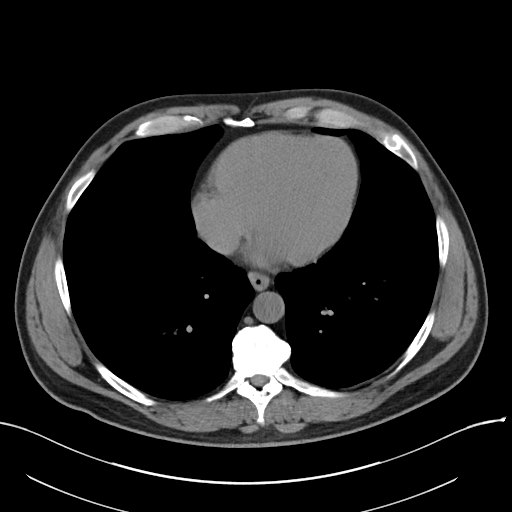

[Series 6: coronal soft tissue · coronal · 0.88mm/px · 3 of 101 slices shown]
[im 34/101  soft-tissue]
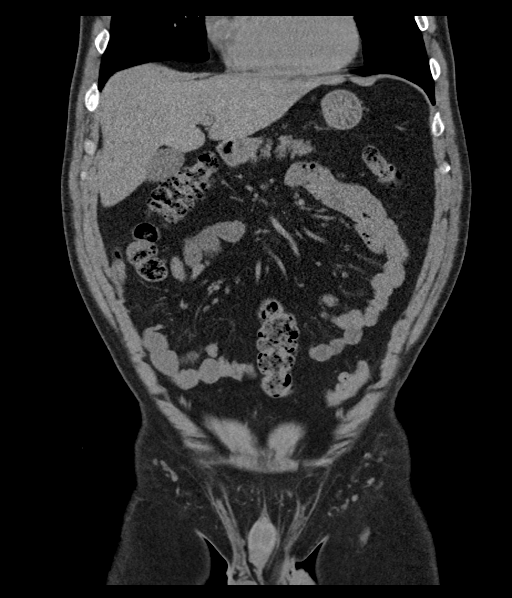
[im 45/101  soft-tissue]
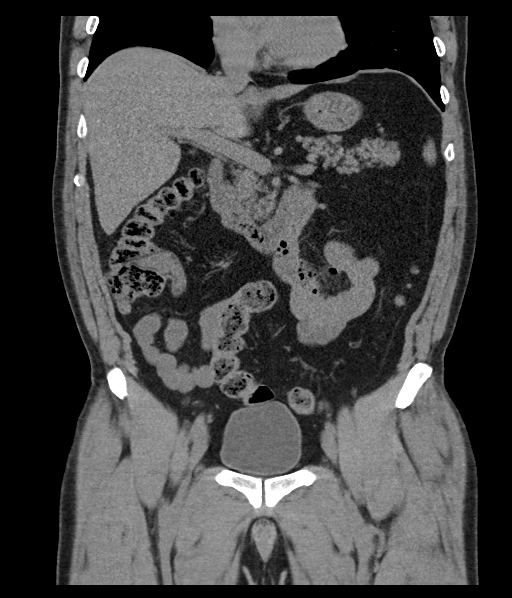
[im 56/101  soft-tissue]
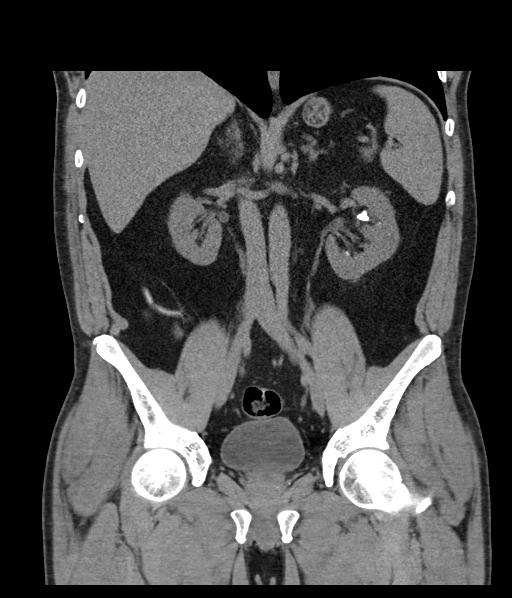

[16 of 46 positions shown; findings below may reference images not displayed]

FINDINGS: Lower chest: Lung bases clear.  No pleural or pericardial effusion.

Hepatobiliary: No focal liver abnormality is seen. No gallstones,
gallbladder wall thickening, or biliary dilatation.

Pancreas: Unremarkable. No pancreatic ductal dilatation or
surrounding inflammatory changes.

Spleen: Normal in size without focal abnormality.

Adrenals/Urinary Tract: The adrenal glands appear normal. The
patient has multiple nonobstructing bilateral renal stones. One of
the largest on the right is in the upper pole and measures 0.8 cm
and the largest on the left is in the midpole and measures 1 cm.
There is no hydronephrosis on the right or left but the patient does
have a 0.5 cm right ureteral stone at approximately the level of the
superior endplate of L4. Urinary bladder is negative.

Stomach/Bowel: Stomach is within normal limits. Appendix appears
normal. No evidence of bowel wall thickening, distention, or
inflammatory changes.

Vascular/Lymphatic: No significant vascular findings are present. No
enlarged abdominal or pelvic lymph nodes.

Reproductive: Prostate is unremarkable.

Other: None.

Musculoskeletal: No acute or focal abnormality.
IMPRESSION: Multiple bilateral nonobstructing renal stones. The patient does
have a 0.5 cm stone in the right ureter approximately at the level
of the superior endplate of L4. There is no hydronephrosis on the
right or left. Negative for left ureteral stone.

## 2022-12-06 DIAGNOSIS — G4733 Obstructive sleep apnea (adult) (pediatric): Secondary | ICD-10-CM | POA: Diagnosis not present

## 2022-12-06 DIAGNOSIS — E782 Mixed hyperlipidemia: Secondary | ICD-10-CM | POA: Diagnosis not present

## 2022-12-06 DIAGNOSIS — Z0000 Encounter for general adult medical examination without abnormal findings: Secondary | ICD-10-CM | POA: Diagnosis not present

## 2022-12-06 DIAGNOSIS — F321 Major depressive disorder, single episode, moderate: Secondary | ICD-10-CM | POA: Diagnosis not present

## 2022-12-06 DIAGNOSIS — Z125 Encounter for screening for malignant neoplasm of prostate: Secondary | ICD-10-CM | POA: Diagnosis not present

## 2023-01-31 ENCOUNTER — Other Ambulatory Visit: Payer: Self-pay | Admitting: Internal Medicine

## 2023-01-31 DIAGNOSIS — R410 Disorientation, unspecified: Secondary | ICD-10-CM

## 2023-01-31 DIAGNOSIS — F322 Major depressive disorder, single episode, severe without psychotic features: Secondary | ICD-10-CM | POA: Diagnosis not present

## 2023-01-31 DIAGNOSIS — Z0000 Encounter for general adult medical examination without abnormal findings: Secondary | ICD-10-CM | POA: Diagnosis not present

## 2023-01-31 DIAGNOSIS — E782 Mixed hyperlipidemia: Secondary | ICD-10-CM | POA: Diagnosis not present

## 2023-01-31 DIAGNOSIS — G4733 Obstructive sleep apnea (adult) (pediatric): Secondary | ICD-10-CM | POA: Diagnosis not present

## 2023-02-09 ENCOUNTER — Encounter: Payer: Self-pay | Admitting: Internal Medicine

## 2023-02-16 ENCOUNTER — Ambulatory Visit
Admission: RE | Admit: 2023-02-16 | Discharge: 2023-02-16 | Disposition: A | Discharge status: Home / Self Care | Payer: BC Managed Care – PPO | Source: Ambulatory Visit | Attending: Internal Medicine | Admitting: Internal Medicine

## 2023-02-16 DIAGNOSIS — R410 Disorientation, unspecified: Secondary | ICD-10-CM | POA: Diagnosis not present

## 2023-02-28 DIAGNOSIS — G4733 Obstructive sleep apnea (adult) (pediatric): Secondary | ICD-10-CM | POA: Diagnosis not present

## 2023-02-28 DIAGNOSIS — E782 Mixed hyperlipidemia: Secondary | ICD-10-CM | POA: Diagnosis not present

## 2023-02-28 DIAGNOSIS — F322 Major depressive disorder, single episode, severe without psychotic features: Secondary | ICD-10-CM | POA: Diagnosis not present

## 2023-02-28 DIAGNOSIS — F411 Generalized anxiety disorder: Secondary | ICD-10-CM | POA: Diagnosis not present

## 2023-03-27 DIAGNOSIS — F1411 Cocaine abuse, in remission: Secondary | ICD-10-CM | POA: Insufficient documentation

## 2023-03-27 DIAGNOSIS — F1020 Alcohol dependence, uncomplicated: Secondary | ICD-10-CM | POA: Insufficient documentation

## 2023-03-27 DIAGNOSIS — R001 Bradycardia, unspecified: Secondary | ICD-10-CM | POA: Diagnosis not present

## 2023-03-27 DIAGNOSIS — F410 Panic disorder [episodic paroxysmal anxiety] without agoraphobia: Secondary | ICD-10-CM | POA: Diagnosis not present

## 2023-03-27 DIAGNOSIS — Z87891 Personal history of nicotine dependence: Secondary | ICD-10-CM | POA: Diagnosis not present

## 2023-03-27 DIAGNOSIS — Z79899 Other long term (current) drug therapy: Secondary | ICD-10-CM | POA: Diagnosis not present

## 2023-03-27 DIAGNOSIS — G4733 Obstructive sleep apnea (adult) (pediatric): Secondary | ICD-10-CM | POA: Diagnosis not present

## 2023-03-27 DIAGNOSIS — F13239 Sedative, hypnotic or anxiolytic dependence with withdrawal, unspecified: Secondary | ICD-10-CM | POA: Diagnosis not present

## 2023-03-27 DIAGNOSIS — R079 Chest pain, unspecified: Secondary | ICD-10-CM | POA: Diagnosis not present

## 2023-03-27 DIAGNOSIS — F1320 Sedative, hypnotic or anxiolytic dependence, uncomplicated: Secondary | ICD-10-CM | POA: Insufficient documentation

## 2023-03-27 DIAGNOSIS — F411 Generalized anxiety disorder: Secondary | ICD-10-CM | POA: Diagnosis not present

## 2023-03-27 DIAGNOSIS — R569 Unspecified convulsions: Secondary | ICD-10-CM | POA: Insufficient documentation

## 2023-03-27 DIAGNOSIS — E7220 Disorder of urea cycle metabolism, unspecified: Secondary | ICD-10-CM | POA: Diagnosis not present

## 2023-03-27 DIAGNOSIS — E872 Acidosis, unspecified: Secondary | ICD-10-CM | POA: Diagnosis not present

## 2023-03-27 DIAGNOSIS — F419 Anxiety disorder, unspecified: Secondary | ICD-10-CM | POA: Diagnosis not present

## 2023-03-27 DIAGNOSIS — R4182 Altered mental status, unspecified: Secondary | ICD-10-CM | POA: Diagnosis not present

## 2023-03-27 DIAGNOSIS — F13230 Sedative, hypnotic or anxiolytic dependence with withdrawal, uncomplicated: Secondary | ICD-10-CM | POA: Diagnosis not present

## 2023-03-27 DIAGNOSIS — R0602 Shortness of breath: Secondary | ICD-10-CM | POA: Diagnosis not present

## 2023-03-27 DIAGNOSIS — G4089 Other seizures: Secondary | ICD-10-CM | POA: Diagnosis not present

## 2023-03-27 DIAGNOSIS — K219 Gastro-esophageal reflux disease without esophagitis: Secondary | ICD-10-CM | POA: Diagnosis not present

## 2023-04-05 ENCOUNTER — Emergency Department (HOSPITAL_COMMUNITY)

## 2023-04-05 ENCOUNTER — Other Ambulatory Visit: Payer: Self-pay

## 2023-04-05 ENCOUNTER — Emergency Department (HOSPITAL_COMMUNITY)
Admission: EM | Admit: 2023-04-05 | Discharge: 2023-04-06 | Disposition: A | Discharge status: Home / Self Care | Attending: Emergency Medicine | Admitting: Emergency Medicine

## 2023-04-05 ENCOUNTER — Encounter (HOSPITAL_COMMUNITY): Payer: Self-pay

## 2023-04-05 DIAGNOSIS — F309 Manic episode, unspecified: Secondary | ICD-10-CM | POA: Diagnosis not present

## 2023-04-05 DIAGNOSIS — R9431 Abnormal electrocardiogram [ECG] [EKG]: Secondary | ICD-10-CM | POA: Diagnosis not present

## 2023-04-05 DIAGNOSIS — T50904A Poisoning by unspecified drugs, medicaments and biological substances, undetermined, initial encounter: Secondary | ICD-10-CM | POA: Diagnosis not present

## 2023-04-05 DIAGNOSIS — Z79899 Other long term (current) drug therapy: Secondary | ICD-10-CM | POA: Diagnosis not present

## 2023-04-05 DIAGNOSIS — F39 Unspecified mood [affective] disorder: Secondary | ICD-10-CM | POA: Diagnosis not present

## 2023-04-05 DIAGNOSIS — F23 Brief psychotic disorder: Secondary | ICD-10-CM | POA: Diagnosis not present

## 2023-04-05 DIAGNOSIS — F1310 Sedative, hypnotic or anxiolytic abuse, uncomplicated: Secondary | ICD-10-CM | POA: Insufficient documentation

## 2023-04-05 DIAGNOSIS — R456 Violent behavior: Secondary | ICD-10-CM | POA: Diagnosis not present

## 2023-04-05 DIAGNOSIS — D72829 Elevated white blood cell count, unspecified: Secondary | ICD-10-CM | POA: Insufficient documentation

## 2023-04-05 DIAGNOSIS — F411 Generalized anxiety disorder: Secondary | ICD-10-CM | POA: Diagnosis not present

## 2023-04-05 DIAGNOSIS — F29 Unspecified psychosis not due to a substance or known physiological condition: Secondary | ICD-10-CM | POA: Diagnosis not present

## 2023-04-05 DIAGNOSIS — T887XXA Unspecified adverse effect of drug or medicament, initial encounter: Secondary | ICD-10-CM | POA: Diagnosis not present

## 2023-04-05 DIAGNOSIS — F13239 Sedative, hypnotic or anxiolytic dependence with withdrawal, unspecified: Secondary | ICD-10-CM | POA: Diagnosis not present

## 2023-04-05 DIAGNOSIS — R4182 Altered mental status, unspecified: Secondary | ICD-10-CM | POA: Diagnosis not present

## 2023-04-05 LAB — COMPREHENSIVE METABOLIC PANEL
ALT: 60 U/L — ABNORMAL HIGH (ref 0–44)
AST: 120 U/L — ABNORMAL HIGH (ref 15–41)
Albumin: 5.2 g/dL — ABNORMAL HIGH (ref 3.5–5.0)
Alkaline Phosphatase: 57 U/L (ref 38–126)
Anion gap: 15 (ref 5–15)
BUN: 31 mg/dL — ABNORMAL HIGH (ref 6–20)
CO2: 23 mmol/L (ref 22–32)
Calcium: 10.5 mg/dL — ABNORMAL HIGH (ref 8.9–10.3)
Chloride: 107 mmol/L (ref 98–111)
Creatinine, Ser: 1.08 mg/dL (ref 0.61–1.24)
GFR, Estimated: >60 mL/min (ref >60)
Glucose, Bld: 110 mg/dL — ABNORMAL HIGH (ref 70–99)
Potassium: 4.5 mmol/L (ref 3.5–5.1)
Sodium: 145 mmol/L (ref 135–145)
Total Bilirubin: 2.4 mg/dL — ABNORMAL HIGH (ref 0.0–1.2)
Total Protein: 8 g/dL (ref 6.5–8.1)

## 2023-04-05 LAB — CBC WITH DIFFERENTIAL/PLATELET
Abs Immature Granulocytes: 0.04 10*3/uL (ref 0.00–0.07)
Basophils Absolute: 0.1 10*3/uL (ref 0.0–0.1)
Basophils Relative: 1 %
Eosinophils Absolute: 0 10*3/uL (ref 0.0–0.5)
Eosinophils Relative: 0 %
HCT: 46.7 % (ref 39.0–52.0)
Hemoglobin: 15.9 g/dL (ref 13.0–17.0)
Immature Granulocytes: 0 %
Lymphocytes Relative: 12 %
Lymphs Abs: 1.5 10*3/uL (ref 0.7–4.0)
MCH: 31.4 pg (ref 26.0–34.0)
MCHC: 34 g/dL (ref 30.0–36.0)
MCV: 92.3 fL (ref 80.0–100.0)
Monocytes Absolute: 1 10*3/uL (ref 0.1–1.0)
Monocytes Relative: 7 %
Neutro Abs: 10.2 10*3/uL — ABNORMAL HIGH (ref 1.7–7.7)
Neutrophils Relative %: 80 %
Platelets: 322 10*3/uL (ref 150–400)
RBC: 5.06 MIL/uL (ref 4.22–5.81)
RDW: 12.5 % (ref 11.5–15.5)
WBC: 12.8 10*3/uL — ABNORMAL HIGH (ref 4.0–10.5)
nRBC: 0 % (ref 0.0–0.2)

## 2023-04-05 LAB — ETHANOL: Alcohol, Ethyl (B): <10 mg/dL (ref <10)

## 2023-04-05 LAB — ACETAMINOPHEN LEVEL: Acetaminophen (Tylenol), Serum: <10 ug/mL — ABNORMAL LOW (ref 10–30)

## 2023-04-05 LAB — SALICYLATE LEVEL: Salicylate Lvl: <7 mg/dL — ABNORMAL LOW (ref 7.0–30.0)

## 2023-04-05 MED ORDER — LACTATED RINGERS IV BOLUS
1000.0000 mL | Freq: Once | INTRAVENOUS | Status: AC
  Administered 2023-04-05: 1000 mL via INTRAVENOUS

## 2023-04-05 MED ORDER — ZIPRASIDONE MESYLATE 20 MG IM SOLR
INTRAMUSCULAR | Status: AC
  Administered 2023-04-05: 20 mg
  Filled 2023-04-05: qty 20

## 2023-04-05 MED ORDER — CARMEX CLASSIC LIP BALM EX OINT
TOPICAL_OINTMENT | Freq: Once | CUTANEOUS | Status: AC
  Filled 2023-04-05: qty 10

## 2023-04-05 NOTE — ED Provider Notes (Signed)
 [place] EMERGENCY DEPARTMENT AT [facility] Provider Note   CSN: [id number] Arrival date & time: 04/05/23  2144     History {Add pertinent medical, surgical, social history, OB history to HPI:1} No chief complaint on file.   Michael [name] is a 48 y.o. male.  HPI Patient has a history of acid reflux seizures kidney stones ureteral stones hyperlipidemia anxiety.  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  There is notes in the EMR indicating patient was seen earlier this month at [facility].  Patient was found yelling outside.  Police were called.  Patient was aggressive and the patient had to be restrained Started to get a clear history from the patient.  He is alert and answering questions but responds tangentially.  He asked where we were selling crack.  However when I asked him if he used any today he does not respond    Home Medications Prior to Admission medications   Medication Sig Start Date End Date Taking? Authorizing Provider  acetaminophen (TYLENOL) 500 MG tablet Take 1,000 mg by mouth every 6 (six) hours as needed.    [provider]  buPROPion (ZYBAN) 150 MG 12 hr tablet Take 150 mg by mouth daily.    [provider]  ibuprofen (ADVIL,MOTRIN) 200 MG tablet Take 200 mg by mouth every 6 (six) hours as needed. Takes 600 mg to 800 mg prn    [provider]  Multiple Vitamins-Minerals (MULTIVITAMIN ADULT, MINERALS, PO) Take by mouth daily.    [provider]  oxyCODONE-acetaminophen (PERCOCET/ROXICET) 5-325 MG tablet Take 1-2 tablets by mouth every 4 (four) hours as needed for severe pain. 05/18/20   Rene Paci, MD  pantoprazole (PROTONIX) 20 MG tablet Take 20 mg by mouth daily.    [provider]  rosuvastatin (CRESTOR) 20 MG tablet Take 20 mg by mouth daily.    [provider]  tamsulosin (FLOMAX) 0.4 MG CAPS capsule Take 1 capsule (0.4 mg total) by mouth  daily. Patient taking differently: Take 0.4 mg by mouth at bedtime. 04/14/20   Jacalyn Lefevre, MD      Allergies    Patient has no known allergies.    Review of Systems   Review of Systems  Physical Exam Updated Vital Signs There were no vitals taken for this visit. Physical Exam Vitals and nursing note reviewed.  Constitutional:      Appearance: He is well-developed. He is not diaphoretic.  HENT:     Head: Normocephalic and atraumatic.     Right Ear: External ear normal.     Left Ear: External ear normal.  Eyes:     General: No scleral icterus.       Right eye: No discharge.        Left eye: No discharge.     Conjunctiva/sclera: Conjunctivae normal.  Neck:     Trachea: No tracheal deviation.  Cardiovascular:     Rate and Rhythm: Normal rate and regular rhythm.  Pulmonary:     Effort: Pulmonary effort is normal. No respiratory distress.     Breath sounds: Normal breath sounds. No stridor. No wheezing or rales.  Abdominal:     General: Bowel sounds are normal. There is no distension.     Palpations: Abdomen is soft.     Tenderness: There is no abdominal tenderness. There is no guarding or rebound.  Musculoskeletal:        General: No tenderness or deformity.  Cervical back: Neck supple.  Skin:    General: Skin is warm and dry.     Findings: No rash.  Neurological:     General: No focal deficit present.     Mental Status: He is alert.     Cranial Nerves: No cranial nerve deficit, dysarthria or facial asymmetry.     Sensory: No sensory deficit.     Motor: No abnormal muscle tone or seizure activity.     Coordination: Coordination normal.  Psychiatric:        Mood and Affect: Mood is anxious. Affect is labile and inappropriate.        Speech: Speech is tangential.        Behavior: Behavior is hyperactive.     ED Results / Procedures / Treatments   Labs (all labs ordered are listed, but only abnormal results are displayed) Labs Reviewed - No data to  display  EKG None  Radiology No results found.  Procedures Procedures  {Document cardiac monitor, telemetry assessment procedure when appropriate:1}  Medications Ordered in ED Medications  ziprasidone (GEODON) 20 MG injection (20 mg  Given 04/05/23 2159)    ED Course/ Medical Decision Making/ A&P   {   Click here for ABCD2, HEART and other calculatorsREFRESH Note before signing :1}                              Medical Decision Making  ***  {Document critical care time when appropriate:1} {Document review of labs and clinical decision tools ie heart score, Chads2Vasc2 etc:1}  {Document your independent review of radiology images, and any outside records:1} {Document your discussion with family members, caretakers, and with consultants:1} {Document social determinants of health affecting pt's care:1} {Document your decision making why or why not admission, treatments were needed:1} Final Clinical Impression(s) / ED Diagnoses Final diagnoses:  None    Rx / DC Orders ED Discharge Orders     None

## 2023-04-06 ENCOUNTER — Encounter (HOSPITAL_COMMUNITY): Payer: Self-pay | Admitting: Psychiatric/Mental Health

## 2023-04-06 ENCOUNTER — Emergency Department (HOSPITAL_COMMUNITY)
Admission: EM | Admit: 2023-04-06 | Discharge: 2023-04-08 | Disposition: A | Discharge status: Other Acute Inpatient Hospital | Attending: Emergency Medicine | Admitting: Emergency Medicine

## 2023-04-06 ENCOUNTER — Encounter (HOSPITAL_COMMUNITY): Payer: Self-pay | Admitting: Emergency Medicine

## 2023-04-06 DIAGNOSIS — F13239 Sedative, hypnotic or anxiolytic dependence with withdrawal, unspecified: Secondary | ICD-10-CM | POA: Insufficient documentation

## 2023-04-06 DIAGNOSIS — F39 Unspecified mood [affective] disorder: Secondary | ICD-10-CM | POA: Insufficient documentation

## 2023-04-06 DIAGNOSIS — F309 Manic episode, unspecified: Secondary | ICD-10-CM | POA: Diagnosis not present

## 2023-04-06 DIAGNOSIS — Z125 Encounter for screening for malignant neoplasm of prostate: Secondary | ICD-10-CM | POA: Insufficient documentation

## 2023-04-06 DIAGNOSIS — F411 Generalized anxiety disorder: Secondary | ICD-10-CM | POA: Insufficient documentation

## 2023-04-06 DIAGNOSIS — G8929 Other chronic pain: Secondary | ICD-10-CM | POA: Insufficient documentation

## 2023-04-06 DIAGNOSIS — F322 Major depressive disorder, single episode, severe without psychotic features: Secondary | ICD-10-CM | POA: Insufficient documentation

## 2023-04-06 DIAGNOSIS — F29 Unspecified psychosis not due to a substance or known physiological condition: Secondary | ICD-10-CM | POA: Insufficient documentation

## 2023-04-06 DIAGNOSIS — F23 Brief psychotic disorder: Secondary | ICD-10-CM | POA: Insufficient documentation

## 2023-04-06 LAB — CBC WITH DIFFERENTIAL/PLATELET
Abs Immature Granulocytes: 0.03 10*3/uL (ref 0.00–0.07)
Basophils Absolute: 0.1 10*3/uL (ref 0.0–0.1)
Basophils Relative: 1 %
Eosinophils Absolute: 0.1 10*3/uL (ref 0.0–0.5)
Eosinophils Relative: 1 %
HCT: 42.5 % (ref 39.0–52.0)
Hemoglobin: 15.2 g/dL (ref 13.0–17.0)
Immature Granulocytes: 0 %
Lymphocytes Relative: 20 %
Lymphs Abs: 2.1 10*3/uL (ref 0.7–4.0)
MCH: 32.4 pg (ref 26.0–34.0)
MCHC: 35.8 g/dL (ref 30.0–36.0)
MCV: 90.6 fL (ref 80.0–100.0)
Monocytes Absolute: 1 10*3/uL (ref 0.1–1.0)
Monocytes Relative: 10 %
Neutro Abs: 7.3 10*3/uL (ref 1.7–7.7)
Neutrophils Relative %: 68 %
Platelets: 290 10*3/uL (ref 150–400)
RBC: 4.69 MIL/uL (ref 4.22–5.81)
RDW: 12.2 % (ref 11.5–15.5)
WBC: 10.7 10*3/uL — ABNORMAL HIGH (ref 4.0–10.5)
nRBC: 0 % (ref 0.0–0.2)

## 2023-04-06 LAB — ACETAMINOPHEN LEVEL: Acetaminophen (Tylenol), Serum: <10 ug/mL — ABNORMAL LOW (ref 10–30)

## 2023-04-06 LAB — COMPREHENSIVE METABOLIC PANEL
ALT: 72 U/L — ABNORMAL HIGH (ref 0–44)
AST: 122 U/L — ABNORMAL HIGH (ref 15–41)
Albumin: 4.6 g/dL (ref 3.5–5.0)
Alkaline Phosphatase: 53 U/L (ref 38–126)
Anion gap: 13 (ref 5–15)
BUN: 25 mg/dL — ABNORMAL HIGH (ref 6–20)
CO2: 22 mmol/L (ref 22–32)
Calcium: 9.4 mg/dL (ref 8.9–10.3)
Chloride: 102 mmol/L (ref 98–111)
Creatinine, Ser: 0.88 mg/dL (ref 0.61–1.24)
GFR, Estimated: >60 mL/min (ref >60)
Glucose, Bld: 116 mg/dL — ABNORMAL HIGH (ref 70–99)
Potassium: 3.2 mmol/L — ABNORMAL LOW (ref 3.5–5.1)
Sodium: 137 mmol/L (ref 135–145)
Total Bilirubin: 2.5 mg/dL — ABNORMAL HIGH (ref 0.0–1.2)
Total Protein: 7 g/dL (ref 6.5–8.1)

## 2023-04-06 LAB — AMMONIA
Ammonia: 45 umol/L — ABNORMAL HIGH (ref 9–35)
Ammonia: 25 umol/L (ref 9–35)

## 2023-04-06 LAB — RAPID URINE DRUG SCREEN, HOSP PERFORMED
Amphetamines: NOT DETECTED
Barbiturates: NOT DETECTED
Benzodiazepines: POSITIVE — AB
Cocaine: NOT DETECTED
Opiates: NOT DETECTED
Tetrahydrocannabinol: NOT DETECTED

## 2023-04-06 LAB — ETHANOL: Alcohol, Ethyl (B): <10 mg/dL (ref <10)

## 2023-04-06 LAB — SALICYLATE LEVEL: Salicylate Lvl: <7 mg/dL — ABNORMAL LOW (ref 7.0–30.0)

## 2023-04-06 MED ORDER — LORAZEPAM 2 MG/ML IJ SOLN: 0.0000 mg | Freq: Four times a day (QID) | INTRAMUSCULAR | Status: DC

## 2023-04-06 MED ORDER — LORAZEPAM 2 MG/ML IJ SOLN: 0.0000 mg | Freq: Two times a day (BID) | INTRAMUSCULAR | Status: DC

## 2023-04-06 MED ORDER — DIPHENHYDRAMINE HCL 50 MG/ML IJ SOLN
50.0000 mg | Freq: Once | INTRAMUSCULAR | Status: AC
  Administered 2023-04-06: 50 mg via INTRAVENOUS
  Filled 2023-04-06: qty 1

## 2023-04-06 MED ORDER — LORAZEPAM 2 MG/ML IJ SOLN
2.0000 mg | Freq: Once | INTRAMUSCULAR | Status: AC
  Administered 2023-04-06: 2 mg via INTRAVENOUS
  Filled 2023-04-06: qty 1

## 2023-04-06 MED ORDER — LORAZEPAM 1 MG PO TABS
1.0000 mg | ORAL_TABLET | Freq: Four times a day (QID) | ORAL | Status: DC | PRN
  Administered 2023-04-06 – 2023-04-07 (×2): 1 mg via ORAL
  Filled 2023-04-06 (×2): qty 1

## 2023-04-06 MED ORDER — LORAZEPAM 1 MG PO TABS: 0.0000 mg | ORAL_TABLET | Freq: Two times a day (BID) | ORAL | Status: DC

## 2023-04-06 MED ORDER — LORAZEPAM 1 MG PO TABS: 0.0000 mg | ORAL_TABLET | Freq: Four times a day (QID) | ORAL | Status: DC

## 2023-04-06 MED ORDER — HALOPERIDOL LACTATE 5 MG/ML IJ SOLN
5.0000 mg | Freq: Once | INTRAMUSCULAR | Status: AC
  Administered 2023-04-06: 5 mg via INTRAVENOUS
  Filled 2023-04-06: qty 1

## 2023-04-06 NOTE — ED Provider Notes (Signed)
  Physical Exam  BP (!) 99/55   Pulse (!) 57   Temp 98.7 F (37.1 C) (Oral)   Resp 14   SpO2 95%   Physical Exam  Procedures  Procedures  ED Course / MDM   Clinical Course as of 04/06/23 0825  Thu Apr 05, 2023  2340 Comprehensive metabolic panel(!) [place] panel does show increased bilirubin and LFTs. [JK]  2340 Alcohol level negative.  Salicylate negative.  Acetaminophen negative [JK]  2341 CBC with Diff(!) CBC shows increased white blood cell count [JK]  Fri Apr 06, 2023  0435 Benzodiazepines(!): POSITIVE [TY]  0455 Albumin(!): 5.2 [TY]    Clinical Course User Index [JK] Linwood Dibbles, MD [TY] Coral Spikes, DO   Medical Decision Making I, Anders Simmonds, assumed care of this patient.  In brief 48 year old male came in yesterday intoxicated, was evaluated prior ED team, medical workup negative.  Had behavioral health evaluation.  On my assessment, patient alert, oriented.  He is in the room with his mother.  No SI, no HI, not responding to internal stimuli.  Likely some additional stimulants on board yesterday.  Patient seen evaluate by psychiatry.  They have cleared him.  I agree with their assessment.  Will discharge.  Amount and/or Complexity of Data Reviewed Labs: ordered. Decision-making details documented in ED Course. Radiology: ordered.  Risk OTC drugs. Prescription drug management.          Anders Simmonds T, DO 04/06/23 [phone]

## 2023-04-06 NOTE — BH Assessment (Signed)
 Patient was deferred to [facility] for a telepsych assessment. The assigned care coordinator will provide updates regarding the scheduling of the assessment. [facility]IS coordinator can be reached at [phone] for further information on the timing of the telepsych evaluation.

## 2023-04-06 NOTE — BH Assessment (Addendum)
 Per Colin Mulders, RN: "Hi, pt is not able to engage in assessment at this time, he is very sleepy d/t medication and being up for days."   Clinician asked RN to notify her once the pt is alert and able to engage in TTS assessment.   Redmond Pulling, MS, [facility], [facility] Triage Specialist [phone]

## 2023-04-06 NOTE — Discharge Instructions (Addendum)
 Please follow-up with your PCP.  Your blood work today overall was normal.  Your ammonia was slightly elevated, however does not appear to be causing you problems today.  Return emergency department as needed for your medical care.

## 2023-04-06 NOTE — ED Notes (Signed)
 Pt very combative, agitated, and loud on arrival. Arrives with medic with violent restraints in place and received 5mg  of versed PTA. [employer] and [employer] at bedside.

## 2023-04-06 NOTE — ED Provider Notes (Signed)
 Received signout; see prior team's note for full HPI.  Patient continued to be agitated and required Ativan.  Mother present at bedside noted that he is seemingly been manic for the past week, not sleeping, pressured speech.  CT head negative no fever or elevated white count to suggest infectious process.  No gross localizing neurodeficits tox workup negative.  Minor elevation in ammonia 45, but was 300s at [place] several weeks ago.  Urine drug screen positive for benzos.  While possible he could be withdrawing from benzos, would expect hypertension and tachycardia.  No reported seizure activity.  Family stating that symptoms been ongoing for the past week or so question psychiatric/psychosis component.  Will have behavioral health/psych see patient.   Coral Spikes, DO 04/06/23 0500

## 2023-04-06 NOTE — ED Notes (Addendum)
 Pt. in burgundy scrubs and wanded by security. Pt. Has 1 belongings bag. Pt. Has 1 green shirt, 1 pr white crocs and 1 black short. Pt. Belongings locked up in cabinet 16-18 on the Res side behind the nurses station.

## 2023-04-06 NOTE — ED Provider Notes (Signed)
 [place] EMERGENCY DEPARTMENT AT [facility] Provider Note   CSN: [id number] Arrival date & time: 04/06/23  2040     History  Chief Complaint  Patient presents with   Manic Behavior    Michael [name] is a 48 y.o. male history of benzo withdrawal, presenting with manic behavior.  Patient had a recent admission at [facility] for altered mental status.  Patient had elevated ammonia level at that time.  Patient also was thought to have benzo withdrawal.  Patient had CT head and additional workup that were unremarkable.  Patient came in yesterday for manic behavior.  Patient apparently was intoxicated then.  Patient sobered up and reported that he felt better.  Patient was seen by psychiatry and was cleared.  After he went home, parents were very concerned about his behavior.  He became very manic and focused on little details.  For example he would put sticks around the tent.  He also would talk constantly.  He would respond to internal stimuli.  Patient also is not sleeping.  Patient also will be hyperfocused on lining up golf balls.  EMS was called.  Patient is unable to give me much history.  He is just laughing at bedside and states that he is feeling fine.  The history is provided by the EMS personnel.       Home Medications Prior to Admission medications   Medication Sig Start Date End Date Taking? Authorizing Provider  acetaminophen (TYLENOL) 500 MG tablet Take 1,000 mg by mouth every 6 (six) hours as needed.    [provider]  buPROPion (ZYBAN) 150 MG 12 hr tablet Take 150 mg by mouth daily.    [provider]  ibuprofen (ADVIL,MOTRIN) 200 MG tablet Take 200 mg by mouth every 6 (six) hours as needed. Takes 600 mg to 800 mg prn    [provider]  Multiple Vitamins-Minerals (MULTIVITAMIN ADULT, MINERALS, PO) Take by mouth daily.    [provider]  oxyCODONE-acetaminophen (PERCOCET/ROXICET) 5-325 MG tablet Take 1-2 tablets by mouth every  4 (four) hours as needed for severe pain. 05/18/20   Rene Paci, MD  pantoprazole (PROTONIX) 20 MG tablet Take 20 mg by mouth daily.    [provider]  rosuvastatin (CRESTOR) 20 MG tablet Take 20 mg by mouth daily.    [provider]  tamsulosin (FLOMAX) 0.4 MG CAPS capsule Take 1 capsule (0.4 mg total) by mouth daily. Patient taking differently: Take 0.4 mg by mouth at bedtime. 04/14/20   Jacalyn Lefevre, MD      Allergies    Patient has no known allergies.    Review of Systems   Review of Systems  Psychiatric/Behavioral:  Positive for agitation and confusion.   All other systems reviewed and are negative.   Physical Exam Updated Vital Signs BP (!) 137/99 (BP Location: Right Arm)   Pulse 96   Resp 16   SpO2 95%  Physical Exam Vitals and nursing note reviewed.  Constitutional:      Comments: Patient is talking to himself responding to internal stimuli  HENT:     Head: Normocephalic.     Comments: No signs of head trauma    Nose: Nose normal.     Mouth/Throat:     Mouth: Mucous membranes are moist.  Eyes:     Extraocular Movements: Extraocular movements intact.     Pupils: Pupils are equal, round, and reactive to light.  Cardiovascular:     Rate and  Rhythm: Normal rate and regular rhythm.     Pulses: Normal pulses.     Heart sounds: Normal heart sounds.  Pulmonary:     Effort: Pulmonary effort is normal.     Breath sounds: Normal breath sounds.  Abdominal:     General: Abdomen is flat.     Palpations: Abdomen is soft.  Musculoskeletal:        General: Normal range of motion.     Cervical back: Normal range of motion and neck supple.  Skin:    General: Skin is warm.     Capillary Refill: Capillary refill takes less than 2 seconds.  Neurological:     Comments: Patient is ANO x 3.  Patient has difficulty following commands but no obvious neurodeficit  Psychiatric:     Comments: Patient has tangential speech and is agitated and  responding to internal stimuli     ED Results / Procedures / Treatments   Labs (all labs ordered are listed, but only abnormal results are displayed) Labs Reviewed  CBC WITH DIFFERENTIAL/PLATELET  COMPREHENSIVE METABOLIC PANEL  ETHANOL  SALICYLATE LEVEL  ACETAMINOPHEN LEVEL  RAPID URINE DRUG SCREEN, HOSP PERFORMED  AMMONIA    EKG None  Radiology CT Head Wo Contrast Result Date: 04/06/2023 CLINICAL DATA:  Altered mental status. EXAM: CT HEAD WITHOUT CONTRAST TECHNIQUE: Contiguous axial images were obtained from the base of the skull through the vertex without intravenous contrast. RADIATION DOSE REDUCTION: This exam was performed according to the departmental dose-optimization program which includes automated exposure control, adjustment of the mA and/or kV according to patient size and/or use of iterative reconstruction technique. COMPARISON:  February 16, 2023 FINDINGS: Brain: No evidence of acute infarction, hemorrhage, hydrocephalus, extra-axial collection or mass lesion/mass effect. Vascular: No hyperdense vessel or unexpected calcification. Skull: Normal. Negative for fracture or focal lesion. Sinuses/Orbits: No acute finding. Other: None. IMPRESSION: No acute intracranial pathology. Electronically Signed   By: Aram Candela M.D.   On: 04/06/2023 00:53    Procedures Procedures    Medications Ordered in ED Medications  LORazepam (ATIVAN) injection 2 mg (has no administration in time range)  diphenhydrAMINE (BENADRYL) injection 50 mg (has no administration in time range)  haloperidol lactate (HALDOL) injection 5 mg (has no administration in time range)    ED Course/ Medical Decision Making/ A&P                                 Medical Decision Making Michael [name] is a 48 y.o. male here presenting with agitation.  Patient was seen here yesterday and was agitated.  Patient was cleared by psych yesterday.  Patient went home and became more agitated and appears to be  manic.  Patient is pacing around and responding to internal stimuli.  I have filled out IVC paperwork.  Will check medical clearance labs including ammonia level.  Will also give Ativan Haldol and Benadryl  10:13 PM I talked to the parents were at bedside now.  They were able to give any more history.  Patient's labs were reviewed and Tylenol and alcohol and salicylates are negative.  Ammonia level is now negative.  Patient is medically cleared for psych eval.   Problems Addressed: [facility]): acute illness or injury  Amount and/or Complexity of Data Reviewed Labs: ordered. Decision-making details documented in ED Course.  Risk Prescription drug management.    Final Clinical Impression(s) / ED Diagnoses Final diagnoses:  None  Rx / DC Orders ED Discharge Orders     None         Charlynne Pander, MD 04/06/23 2213

## 2023-04-06 NOTE — Consult Note (Signed)
 [facility] Consult Note  Patient Name: Michael [name] MRN: [id number] DOB: 1975/06/13 DATE OF Consult: 04/06/2023  PRIMARY PSYCHIATRIC DIAGNOSES  1.  Mood Disorder, Unspecified R/O Delirium related to recent benzodiazepine withdrawal R/O Mania vs. Psychosis, Unspecified     RECOMMENDATIONS  Recommendations: Medication recommendations: Agree with current regimen for benzodiazepine withdrawal. Agitation- Zyprexa 10mg  PO/IM Q6H PRN for acute agitation.  Non-Medication/therapeutic recommendations: Patient to follow up with outpatient psychiatrist at [facility] for further evaluation and observation. Patient is 5 days out from benzodiazepine use, possible lingering effects of withdrawal. Encouraged to monitor symptoms of the course of 1-2 weeks. Encouraged to return to the ED if symptoms worsen.   Is inpatient psychiatric hospitalization recommended for this patient? No (Explain why): Patient does not meet criteria for IVC nor inpatient admission at this time Follow-Up Telepsychiatry C/L services: We will sign off for now. Please re-consult our service if needed for any concerning changes in the patient's condition, discharge planning, or questions. Communication: Treatment team members (and family members if applicable) who were involved in treatment/care discussions and planning, and with whom we spoke or engaged with via secure text/chat, include the following: ED primary team  Thank you for involving [place] in the care of this patient. If you have any additional questions or concerns, please call [phone] and ask for me or the provider on-call.  TELEPSYCHIATRY ATTESTATION & CONSENT  As the provider for this telehealth consult, I attest that I verified the patient's identity using two separate identifiers, introduced myself to the patient, provided my credentials, disclosed my location, and performed this encounter via a HIPAA-compliant, real-time, face-to-face, two-way, interactive audio and  video platform and with the full consent and agreement of the patient (or guardian as applicable.)  Patient physical location: ED in [facility]. Telehealth provider physical location: home office in state of [address].  Video start time: 0630 (Central Time) Video end time: 0707 (Central Time)  IDENTIFYING DATA  Michael [name] is a 48 y.o. year-old male for whom a psychiatric consultation has been ordered by the primary provider. The patient was identified using two separate identifiers.  CHIEF COMPLAINT/REASON FOR CONSULT  AMS   HISTORY OF PRESENT ILLNESS (HPI)  The patient is a 48yo male, with no past psychiatric history, who presented to the emergency department with concerns for manic behavior for the last 1 week. Upon arrival patient was agitated, combative, loud. Arrived in violent restraints; received Versed 5mg  prior to arrival. Agitated in the ED required both Ativan and Geodon and soft wrist restraints.   History of benzodiazepine dependence. Reports ~3 year history of dependence on Xanax and Klonopin over the years. Recently admitted to [facility] 1 week ago for benzodiazepine withdrawal seizure and elevated ammonia. Per patient and mother at bedside, patient was discharged on Sunday. Patient denies further use of benzodiazepines since discharge; was not prescribed any upon discharge. Did well Monday, Tuesday however Tuesday evening did not sleep and Wednesday appeared with altered mental status. Yesterday patient became agitated at home, was talking to people that weren't there, and was outside most of the day yelling. Neighbors got concerned and call the police who came to the home and brought him to the ED for evaluation.   Patient observed with mildly elevated mood, excessive speech, easily distracted. No irritability. Has not slept in 2 nights. Finally got rest last night with medications. Mentions decreased appetite in the last few days. No auditory hallucinations  this morning, no visual hallucinations. Patient claims  he was yelling at golfers who were hitting balls into his yard. [place] mother confirms and from time to time balls to land in his yard. Patient states he was upset because one could've hit his children. Patient believes these golfers were intentionally hitting the balls in his yard. Patient denies recent stressors, no increase in anxiety, depression. No suicidal or homicidal ideations. Patient denies drug and alcohol use. Lives at home with family. No access to firearms.     PAST PSYCHIATRIC HISTORY  Patient admitted to [facility] 03/27/2023- 04/01/2023 for benzodiazepine withdrawal seizure. Reported chronic benzodiazepine dependence for the last 3 years. Attempted to self-wean off however could not take less than 2mg  without significant withdrawal symptoms. During this admission, Klonopin was utilized to assist with tapering along with Librium. Patient was not discharged on benzodiazepines and [facility]   [facility]al [facility] with Dr. Andrey Campanile at [facility]       Otherwise as per HPI above.  PAST MEDICAL HISTORY  Past Medical History:  Diagnosis Date   Anxiety    Back pain    COVID 02/18/2020   mild headache and fatigue x 5 days all symptoms resolved   FH: heart disease    GERD (gastroesophageal reflux disease)    History of kidney stones    Hyperlipidemia    Rectal bleeding    possible hemorrhoid blood on tissue   Right ureteral stone      HOME MEDICATIONS  Facility Ordered Medications  Medication   [COMPLETED] ziprasidone (GEODON) 20 MG injection   [COMPLETED] lactated ringers bolus 1,000 mL   [COMPLETED] lip balm (CARMEX) ointment   [COMPLETED] LORazepam (ATIVAN) injection 2 mg   LORazepam (ATIVAN) injection 0-4 mg   Or   LORazepam (ATIVAN) tablet 0-4 mg   [START ON 04/08/2023] LORazepam (ATIVAN) injection 0-4 mg   Or   [START ON 04/08/2023] LORazepam (ATIVAN)  tablet 0-4 mg   PTA Medications  Medication Sig   ibuprofen (ADVIL,MOTRIN) 200 MG tablet Take 200 mg by mouth every 6 (six) hours as needed. Takes 600 mg to 800 mg prn   tamsulosin (FLOMAX) 0.4 MG CAPS capsule Take 1 capsule (0.4 mg total) by mouth daily. (Patient taking differently: Take 0.4 mg by mouth at bedtime.)   acetaminophen (TYLENOL) 500 MG tablet Take 1,000 mg by mouth every 6 (six) hours as needed.   rosuvastatin (CRESTOR) 20 MG tablet Take 20 mg by mouth daily.   pantoprazole (PROTONIX) 20 MG tablet Take 20 mg by mouth daily.   buPROPion (ZYBAN) 150 MG 12 hr tablet Take 150 mg by mouth daily.   Multiple Vitamins-Minerals (MULTIVITAMIN ADULT, MINERALS, PO) Take by mouth daily.   oxyCODONE-acetaminophen (PERCOCET/ROXICET) 5-325 MG tablet Take 1-2 tablets by mouth every 4 (four) hours as needed for severe pain.     ALLERGIES  No Known Allergies  SOCIAL & SUBSTANCE USE HISTORY  Social History   Socioeconomic History   Marital status: Single    Spouse name: Not on file   Number of children: Not on file   Years of education: Not on file   Highest education level: Not on file  Occupational History   Not on file  Tobacco Use   Smoking status: Former    Current packs/day: 0.00    Average packs/day: 0.8 packs/day for 10.0 years (7.5 ttl pk-yrs)    Types: Cigarettes    Start date: 02/26/2010    Quit date: 02/27/2020  Years since quitting: 3.1   Smokeless tobacco: Never  Vaping Use   Vaping status: Never Used  Substance and Sexual Activity   Alcohol use: Yes    Comment: 1  or 2 x week   Drug use: No    Types: Marijuana    Comment: hx marijuana last used 2020 per pt as of 05-12-2020   Sexual activity: Not on file  Other Topics Concern   Not on file  Social History Narrative   Not on file   Social Drivers of Health   Financial Resource Strain: Not on file  Food Insecurity: No Food Insecurity (03/28/2023)   [facility]   Hunger Vital Sign    Worried  About Running Out of Food in the Last Year: Never true    Ran Out of Food in the Last Year: Never true  Transportation Needs: No Transportation Needs (03/29/2023)   Received from [facility] - Transportation    Lack of Transportation (Medical): No    Lack of Transportation (Non-Medical): No  Physical Activity: Not on file  Stress: Stress Concern Present (03/28/2023)   Received from [facility] - Occupational Stress Questionnaire    Feeling of Stress : Rather much  Social Connections: Unknown (09/04/2022)   Received from [facility]   Social Network    Social Network: Not on file   Social History   Tobacco Use  Smoking Status Former   Current packs/day: 0.00   Average packs/day: 0.8 packs/day for 10.0 years (7.5 ttl pk-yrs)   Types: Cigarettes   Start date: 02/26/2010   Quit date: 02/27/2020   Years since quitting: 3.1  Smokeless Tobacco Never   Social History   Substance and Sexual Activity  Alcohol Use Yes   Comment: 1  or 2 x week   Social History   Substance and Sexual Activity  Drug Use No   Types: Marijuana   Comment: hx marijuana last used 2020 per pt as of 05-12-2020    Additional pertinent information: lives at home with family   FAMILY HISTORY  Family History  Problem Relation Age of Onset   Diabetes Father      MENTAL STATUS EXAM (MSE)  Mental Status Exam: General Appearance: Fairly Groomed  Orientation:  Full (Time, Place, and Person)  Memory:  Recent;   Fair  Concentration:  Concentration: Fair  Recall:  Good  Attention  Good  Eye Contact:  Good  Speech:  Clear and Coherent and excessive  Language:  Good  Volume:  Normal  Mood: Anxious  Affect:  Appropriate  Thought Process:  Coherent, confusion noted at times  Thought Content:  Rumination  Suicidal Thoughts:  No  Homicidal Thoughts:  No  Judgement:  Impaired  Insight:  Lacking  Psychomotor Activity:  Normal  Akathisia:  No  Fund of  Knowledge:  Good    Assets:  Communication Skills Desire for Improvement Financial Resources/Insurance Housing Leisure Time Physical Health Social Support Transportation  Cognition:  WNL  ADL's:  Intact  AIMS (if indicated):       VITALS  Blood pressure (!) 99/55, pulse (!) 57, temperature 98.7 F (37.1 C), temperature source Oral, resp. rate 14, SpO2 95%.  LABS  Admission on 04/05/2023  Component Date Value Ref Range Status   Sodium 04/05/2023 145  135 - 145 mmol/L Final   Potassium 04/05/2023 4.5  3.5 - 5.1 mmol/L Final   Chloride 04/05/2023 107  98 - 111 mmol/L Final   CO2 04/05/2023 23  22 - 32 mmol/L Final   Glucose, Bld 04/05/2023 110 (H)  70 - 99 mg/dL Final   Glucose reference range applies only to samples taken after fasting for at least 8 hours.   BUN 04/05/2023 31 (H)  6 - 20 mg/dL Final   Creatinine, Ser 04/05/2023 1.08  0.61 - 1.24 mg/dL Final   Calcium 84/13/2440 10.5 (H)  8.9 - 10.3 mg/dL Final   Total Protein 11/19/2534 8.0  6.5 - 8.1 g/dL Final   Albumin 64/40/3474 5.2 (H)  3.5 - 5.0 g/dL Final   AST 25/95/6387 120 (H)  15 - 41 U/L Final   ALT 04/05/2023 60 (H)  0 - 44 U/L Final   Alkaline Phosphatase 04/05/2023 57  38 - 126 U/L Final   Total Bilirubin 04/05/2023 2.4 (H)  0.0 - 1.2 mg/dL Final   GFR, Estimated 04/05/2023 >60  >60 mL/min Final   Comment: (NOTE) Calculated using the CKD-EPI Creatinine Equation (2021)    Anion gap 04/05/2023 15  5 - 15 Final   Performed at [facility], [address], [place], [place] [address]   Alcohol, Ethyl (B) 04/05/2023 <10  <10 mg/dL Final   Comment: (NOTE) Lowest detectable limit for serum alcohol is 10 mg/dL.  For medical purposes only. Performed at [facility], [address] [place], [place] [address]    Opiates 04/06/2023 NONE DETECTED  NONE DETECTED Final   Cocaine 04/06/2023 NONE DETECTED  NONE DETECTED Final   Benzodiazepines 04/06/2023 POSITIVE (A)  NONE DETECTED  Final   Amphetamines 04/06/2023 NONE DETECTED  NONE DETECTED Final   Tetrahydrocannabinol 04/06/2023 NONE DETECTED  NONE DETECTED Final   Barbiturates 04/06/2023 NONE DETECTED  NONE DETECTED Final   Comment: (NOTE) DRUG SCREEN FOR MEDICAL PURPOSES ONLY.  IF CONFIRMATION IS NEEDED FOR ANY PURPOSE, NOTIFY LAB WITHIN 5 DAYS.  LOWEST DETECTABLE LIMITS FOR URINE DRUG SCREEN Drug Class                     Cutoff (ng/mL) Amphetamine and metabolites    1000 Barbiturate and metabolites    200 Benzodiazepine                 200 Opiates and metabolites        300 Cocaine and metabolites        300 THC                            50 Performed at [facility], [address] [place], [place] [address]    WBC 04/05/2023 12.8 (H)  4.0 - 10.5 K/uL Final   RBC 04/05/2023 5.06  4.22 - 5.81 MIL/uL Final   Hemoglobin 04/05/2023 15.9  13.0 - 17.0 g/dL Final   HCT 07/23/1599 46.7  39.0 - 52.0 % Final   MCV 04/05/2023 92.3  80.0 - 100.0 fL Final   MCH 04/05/2023 31.4  26.0 - 34.0 pg Final   MCHC 04/05/2023 34.0  30.0 - 36.0 g/dL Final   RDW 09/32/3557 12.5  11.5 - 15.5 % Final   Platelets 04/05/2023 322  150 - 400 K/uL Final   nRBC 04/05/2023 0.0  0.0 - 0.2 % Final   Neutrophils Relative % 04/05/2023 80  % Final   Neutro Abs 04/05/2023 10.2 (H)  1.7 - 7.7 K/uL Final   Lymphocytes Relative 04/05/2023 12  %  Final   Lymphs Abs 04/05/2023 1.5  0.7 - 4.0 K/uL Final   Monocytes Relative 04/05/2023 7  % Final   Monocytes Absolute 04/05/2023 1.0  0.1 - 1.0 K/uL Final   Eosinophils Relative 04/05/2023 0  % Final   Eosinophils Absolute 04/05/2023 0.0  0.0 - 0.5 K/uL Final   Basophils Relative 04/05/2023 1  % Final   Basophils Absolute 04/05/2023 0.1  0.0 - 0.1 K/uL Final   Immature Granulocytes 04/05/2023 0  % Final   Abs Immature Granulocytes 04/05/2023 0.04  0.00 - 0.07 K/uL Final   Performed at [facility], [address], [place], [place] [address]   Salicylate  Lvl 04/05/2023 <7.0 (L)  7.0 - 30.0 mg/dL Final   Performed at [facility], [address], [place], [place] [address]   Acetaminophen (Tylenol), Serum 04/05/2023 <10 (L)  10 - 30 ug/mL Final   Comment: (NOTE) Therapeutic concentrations vary significantly. A range of 10-30 ug/mL  may be an effective concentration for many patients. However, some  are best treated at concentrations outside of this range. Acetaminophen concentrations >150 ug/mL at 4 hours after ingestion  and >50 ug/mL at 12 hours after ingestion are often associated with  toxic reactions.  Performed at [facility], [address] [place], [place] [address]    Ammonia 04/05/2023 45 (H)  9 - 35 umol/L Final   Performed at [facility], [address] [place], [place] [address]    PSYCHIATRIC REVIEW OF SYSTEMS (ROS)  ROS: Notable for the following relevant positive findings: Review of Systems  Psychiatric/Behavioral:  Negative for depression, hallucinations and suicidal ideas.     Additional findings:      Musculoskeletal: No abnormal movements observed      Gait & Station: Laying/Sitting      Pain Screening: Denies      Nutrition & Dental Concerns: Decrease in food intake and/or loss of appetite  RISK FORMULATION/ASSESSMENT  Is the patient experiencing any suicidal or homicidal ideations: No       Explain if yes:  Protective factors considered for safety management: access to care, outpatient treatment at [facility], housing, social/ family support children, no prior psychiatric treatment, no prior attempts, denies access to firearms   Risk factors/concerns considered for safety management:  Substance abuse/dependence Male gender  Is there a safety management plan with the patient and treatment team to minimize risk factors and promote protective factors: Yes           Explain: Currently in the ED, medication management observation. [facility]  Is crisis care placement or psychiatric hospitalization recommended: No     Based on my current evaluation and risk assessment, patient is determined at this time to be at:  Low risk  *RISK ASSESSMENT Risk assessment is a dynamic process; it is possible that this patient's condition, and risk level, may change. This should be re-evaluated and managed over time as appropriate. Please re-consult psychiatric consult services if additional assistance is needed in terms of risk assessment and management. If your team decides to discharge this patient, please advise the patient how to best access emergency psychiatric services, or to call 911, if their condition worsens or they feel unsafe in any way.   Assunta Gambles, NP Telepsychiatry Consult Services

## 2023-04-06 NOTE — ED Triage Notes (Signed)
 Pt BIB [facility] from home for ams/manic behavior. Pt was seen previously today for same complaint. Hx of elevated Ammonia levels. Pt unable to hold still to obtain vital signs in triage.

## 2023-04-06 NOTE — ED Notes (Signed)
 Pt currently resting very well at this moment. Chest rise and fall noted. CIWA-0, no Ativan required at this time. Will continue to monitor CIWA as ordered. Mom remains at bedside.

## 2023-04-06 NOTE — BH Assessment (Addendum)
 Clinician messaged Rebecca Eaton, RN: "Hey. It's Trey with TTS. Is the pt able to engage in the assessment, if so is the pt under IVC and medically cleared?   Clinician awaiting response.   Redmond Pulling, MS, [facility], [facility] Triage Specialist [phone]

## 2023-04-06 NOTE — ED Notes (Signed)
 Wrist restraints removed per pt and pt mother request. Pt states "I will be good" TTS cart in place

## 2023-04-07 DIAGNOSIS — F23 Brief psychotic disorder: Secondary | ICD-10-CM | POA: Diagnosis not present

## 2023-04-07 DIAGNOSIS — F411 Generalized anxiety disorder: Secondary | ICD-10-CM | POA: Insufficient documentation

## 2023-04-07 LAB — RAPID URINE DRUG SCREEN, HOSP PERFORMED
Amphetamines: NOT DETECTED
Barbiturates: NOT DETECTED
Benzodiazepines: POSITIVE — AB
Cocaine: NOT DETECTED
Opiates: NOT DETECTED
Tetrahydrocannabinol: NOT DETECTED

## 2023-04-07 LAB — AMMONIA: Ammonia: 24 umol/L (ref 9–35)

## 2023-04-07 MED ORDER — OLANZAPINE 5 MG PO TBDP
5.0000 mg | ORAL_TABLET | Freq: Two times a day (BID) | ORAL | Status: DC
Start: 2023-04-07 — End: 2023-04-08
  Administered 2023-04-07 – 2023-04-08 (×3): 5 mg via ORAL
  Filled 2023-04-07 (×3): qty 1

## 2023-04-07 MED ORDER — POTASSIUM CHLORIDE CRYS ER 20 MEQ PO TBCR: 20.0000 meq | EXTENDED_RELEASE_TABLET | Freq: Two times a day (BID) | ORAL | Status: DC

## 2023-04-07 MED ORDER — POTASSIUM CHLORIDE CRYS ER 20 MEQ PO TBCR
20.0000 meq | EXTENDED_RELEASE_TABLET | Freq: Once | ORAL | Status: DC
  Filled 2023-04-07: qty 1

## 2023-04-07 NOTE — ED Notes (Signed)
 Patient allowed staff to get vitals and obtain labs. He did refuse his Potassium pills

## 2023-04-07 NOTE — ED Notes (Signed)
 Attempted to call sheriff during dayshift by Mellody Dance Paramedic and left message for transport.  Need to call in AM for transport and report to facility.

## 2023-04-07 NOTE — ED Notes (Signed)
 Patient hit distress button he is agitated but able to redirect. He is wandering around room pulling at cords and operating computer.

## 2023-04-07 NOTE — ED Notes (Signed)
 RN s/w Alan Ripper from [facility], RN states she can accept patient as long as there is no aggression and on IM meds, he can be can be taken today. Cat from [facility] called stating if patient is not accepted to [facility] they will accept patient.
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
## 2023-04-07 NOTE — ED Notes (Signed)
 Sheriff office called for transportation VM left

## 2023-04-07 NOTE — ED Notes (Addendum)
 RN called to [facility] Dwana Curd giving report. Still waiting on Sheriff Office to return call to set up transportation

## 2023-04-07 NOTE — ED Notes (Signed)
 Patients girl friend left after a 30 minute visit     she said she would be back in an hour at 7:20 for another 30 minute visit before visiting hours is over at 8pm

## 2023-04-07 NOTE — ED Notes (Signed)
Patient has visitor at this time. 

## 2023-04-07 NOTE — Progress Notes (Addendum)
 LCSW Progress Note:  Michael [name]  MRN:  [id number]  04/07/2023 10:35 AM   Motley-Mangrum, Ezra Sites, PMHNP, NP provided an updated disposition, recommending inpatient treatment and involuntary commitment (IVC) for the patient. [facility] AC, Malva Limes, RN, reviewed the patient for admission but declined at this time due to "The patient's need for restraints yesterday, a diagnosis of rule-out delirium, and the patient's current condition. The patient's blood pressure this morning went from 85/32 to 158/140, and repeat vital signs, CIWA, and another ammonia level are needed. The patient is still confused, pulling at cords, and based on the current assessment, he would likely require 1:1 supervision".  At this time, the patient is being declined for admission. Meanwhile, the patient has been faxed out to alternative facilities for consideration of placement. See hospitals listed below.   Destination  Service Provider   Address Phone Fax Patient Preferred  [facility] Patient Placement   [facility] [place], [place] [phone] --  [facility]   [address] [place] [address] [phone] --  [facility]   [address] [place], [place] [address] [phone] --  [facility]   [address] [place], [place] [address] [phone] --  [facility]   [address] [place] [address] [phone] --  [facility]   [address], [place] [address] [phone] --  [facility]   [address], [place] [address] [phone] [facility]   [place] [phone] --  [facility]   [address], [place] [address] [phone] --  [facility]   [address] [place], [place] [phone] --  [facility]   [address], [place] [address] [phone] --  [facility]   [address] [place] [address] [phone] --  [facility]   [address] [place], [place] [address] [phone] --  [facility]   [address], [place] [address] [phone] --  [facility]   [address] [place] [address] [phone] --  [facility]   [address], [place] [address] [phone] --  [facility]   [address] [place] [address] [phone] --  [facility]   [address] [place] [address] [phone] --

## 2023-04-07 NOTE — ED Notes (Signed)
 Advised pt the need for a urine sample gave pt a urinal sitting at the end of the bed closed door for privacy

## 2023-04-07 NOTE — Progress Notes (Addendum)
 LCSW Progress Note:   Kinan Safley  MRN:  [id number]  04/07/2023 10:35 AM   @ 10:58 AM, I received a call from intake coordinator, Alan Ripper, with [facility]. The accepting provider is Dr. Rush Farmer. [place]. Upon arrival via sheriff, the patient will be admitted to the [facility] building. Nurse report can be reached at [phone]. The bed is ready today, 04/07/2023, and the patient may be sent immediately. The patient's care team was provided disposition updates.

## 2023-04-07 NOTE — ED Provider Notes (Signed)
 Emergency Medicine Observation Re-evaluation Note  Khiry Pasquariello IV is a 48 y.o. male, seen on rounds today.  Pt initially presented to the ED for complaints of Manic Behavior Currently, the patient is stretching in his room.  He has no complaints. Patient has mood disorder and some manic tendencies. He will need inpatient psychiatric stabilization.  Physical Exam  BP 132/85 (BP Location: Right Arm)   Pulse 86   Temp 98.4 F (36.9 C)   Resp 18   SpO2 97%  Physical Exam General: No acute distress Cardiac: Regular rate Lungs: No respiratory distress Psych: Calm  ED Course / MDM  EKG:   I have reviewed the labs performed to date as well as medications administered while in observation.  Recent changes in the last 24 hours include -no new changes.  Patient need psychiatric stabilization.  Plan  Current plan is for transfer to [facility]. I have completed EMTALA.  Patient has been notified about the transfer.   Derwood Kaplan, MD 04/07/23 1118

## 2023-04-08 ENCOUNTER — Inpatient Hospital Stay (HOSPITAL_COMMUNITY)
Admission: AD | Admit: 2023-04-08 | Discharge: 2023-04-13 | DRG: 885 | Disposition: A | Discharge status: Home / Self Care | Source: Intra-hospital | Attending: Psychiatry | Admitting: Psychiatry

## 2023-04-08 ENCOUNTER — Encounter (HOSPITAL_COMMUNITY): Payer: Self-pay | Admitting: Psychiatry

## 2023-04-08 ENCOUNTER — Other Ambulatory Visit: Payer: Self-pay

## 2023-04-08 DIAGNOSIS — D72829 Elevated white blood cell count, unspecified: Secondary | ICD-10-CM | POA: Diagnosis not present

## 2023-04-08 DIAGNOSIS — F29 Unspecified psychosis not due to a substance or known physiological condition: Secondary | ICD-10-CM | POA: Diagnosis not present

## 2023-04-08 DIAGNOSIS — F23 Brief psychotic disorder: Principal | ICD-10-CM | POA: Diagnosis present

## 2023-04-08 DIAGNOSIS — Z833 Family history of diabetes mellitus: Secondary | ICD-10-CM

## 2023-04-08 DIAGNOSIS — Z79899 Other long term (current) drug therapy: Secondary | ICD-10-CM | POA: Diagnosis not present

## 2023-04-08 DIAGNOSIS — E785 Hyperlipidemia, unspecified: Secondary | ICD-10-CM | POA: Diagnosis present

## 2023-04-08 DIAGNOSIS — Z87442 Personal history of urinary calculi: Secondary | ICD-10-CM | POA: Diagnosis not present

## 2023-04-08 DIAGNOSIS — F411 Generalized anxiety disorder: Secondary | ICD-10-CM | POA: Diagnosis present

## 2023-04-08 DIAGNOSIS — F1310 Sedative, hypnotic or anxiolytic abuse, uncomplicated: Secondary | ICD-10-CM | POA: Diagnosis not present

## 2023-04-08 DIAGNOSIS — F1010 Alcohol abuse, uncomplicated: Secondary | ICD-10-CM | POA: Diagnosis present

## 2023-04-08 DIAGNOSIS — Z87891 Personal history of nicotine dependence: Secondary | ICD-10-CM

## 2023-04-08 DIAGNOSIS — F309 Manic episode, unspecified: Secondary | ICD-10-CM | POA: Diagnosis not present

## 2023-04-08 DIAGNOSIS — F13239 Sedative, hypnotic or anxiolytic dependence with withdrawal, unspecified: Secondary | ICD-10-CM | POA: Diagnosis not present

## 2023-04-08 DIAGNOSIS — Z8616 Personal history of COVID-19: Secondary | ICD-10-CM | POA: Diagnosis not present

## 2023-04-08 DIAGNOSIS — F39 Unspecified mood [affective] disorder: Secondary | ICD-10-CM | POA: Diagnosis not present

## 2023-04-08 MED ORDER — HYDROXYZINE HCL 25 MG PO TABS
ORAL_TABLET | ORAL | Status: AC
  Filled 2023-04-08: qty 1

## 2023-04-08 MED ORDER — TRAZODONE HCL 50 MG PO TABS
50.0000 mg | ORAL_TABLET | Freq: Every evening | ORAL | Status: DC | PRN
  Administered 2023-04-09: 50 mg via ORAL
  Filled 2023-04-08: qty 1

## 2023-04-08 MED ORDER — PANTOPRAZOLE SODIUM 20 MG PO TBEC
20.0000 mg | DELAYED_RELEASE_TABLET | Freq: Every day | ORAL | Status: DC
Start: 2023-04-09 — End: 2023-04-10
  Administered 2023-04-09 – 2023-04-10 (×2): 20 mg via ORAL
  Filled 2023-04-08 (×5): qty 1

## 2023-04-08 MED ORDER — HALOPERIDOL 5 MG PO TABS: 5.0000 mg | ORAL_TABLET | Freq: Three times a day (TID) | ORAL | Status: DC | PRN

## 2023-04-08 MED ORDER — ALUM & MAG HYDROXIDE-SIMETH 200-200-20 MG/5ML PO SUSP: 30.0000 mL | ORAL | Status: DC | PRN

## 2023-04-08 MED ORDER — OLANZAPINE 5 MG PO TBDP
5.0000 mg | ORAL_TABLET | Freq: Two times a day (BID) | ORAL | Status: DC
Start: 2023-04-08 — End: 2023-04-09
  Administered 2023-04-08 – 2023-04-09 (×2): 5 mg via ORAL
  Filled 2023-04-08 (×7): qty 1

## 2023-04-08 MED ORDER — LORAZEPAM 2 MG/ML IJ SOLN
2.0000 mg | Freq: Three times a day (TID) | INTRAMUSCULAR | Status: DC | PRN
Start: 2023-04-08 — End: 2023-04-13

## 2023-04-08 MED ORDER — ADULT MULTIVITAMIN W/MINERALS CH
ORAL_TABLET | Freq: Every day | ORAL | Status: DC
  Administered 2023-04-09 – 2023-04-13 (×5): 1 via ORAL
  Filled 2023-04-08 (×7): qty 1

## 2023-04-08 MED ORDER — ACETAMINOPHEN 325 MG PO TABS
650.0000 mg | ORAL_TABLET | Freq: Four times a day (QID) | ORAL | Status: DC | PRN
  Administered 2023-04-11: 650 mg via ORAL
  Filled 2023-04-08: qty 2

## 2023-04-08 MED ORDER — OLANZAPINE 5 MG PO TBDP
ORAL_TABLET | ORAL | Status: AC
  Filled 2023-04-08: qty 1

## 2023-04-08 MED ORDER — LORAZEPAM 2 MG/ML IJ SOLN: 2.0000 mg | Freq: Three times a day (TID) | INTRAMUSCULAR | Status: DC | PRN

## 2023-04-08 MED ORDER — HALOPERIDOL LACTATE 5 MG/ML IJ SOLN: 5.0000 mg | Freq: Three times a day (TID) | INTRAMUSCULAR | Status: DC | PRN

## 2023-04-08 MED ORDER — HYDROXYZINE HCL 25 MG PO TABS
25.0000 mg | ORAL_TABLET | Freq: Three times a day (TID) | ORAL | Status: DC | PRN
  Administered 2023-04-09: 25 mg via ORAL
  Filled 2023-04-08: qty 1

## 2023-04-08 MED ORDER — ROSUVASTATIN CALCIUM 20 MG PO TABS
20.0000 mg | ORAL_TABLET | Freq: Every day | ORAL | Status: DC
  Filled 2023-04-08 (×4): qty 1

## 2023-04-08 MED ORDER — HALOPERIDOL LACTATE 5 MG/ML IJ SOLN: 10.0000 mg | Freq: Three times a day (TID) | INTRAMUSCULAR | Status: DC | PRN

## 2023-04-08 MED ORDER — MAGNESIUM HYDROXIDE 400 MG/5ML PO SUSP: 30.0000 mL | Freq: Every day | ORAL | Status: DC | PRN

## 2023-04-08 MED ORDER — DIPHENHYDRAMINE HCL 50 MG/ML IJ SOLN: 50.0000 mg | Freq: Three times a day (TID) | INTRAMUSCULAR | Status: DC | PRN

## 2023-04-08 MED ORDER — DIPHENHYDRAMINE HCL 25 MG PO CAPS: 50.0000 mg | ORAL_CAPSULE | Freq: Three times a day (TID) | ORAL | Status: DC | PRN

## 2023-04-08 NOTE — ED Notes (Signed)
 Writer contacted Michael [name] to let them know pt will not be able to come to facility until Monday due to sheriff unavailable.

## 2023-04-08 NOTE — ED Notes (Signed)
 Dinner just received

## 2023-04-08 NOTE — ED Notes (Signed)
 Patient slept most of the day but when he woke up he had a visit with his mom and dad. Patient has shown no signs of aggression or agitation today or had any problems. He has been pleasant and polite, and also carrying on conversation with me when I went in and made conversation with him while taking his vital signs.

## 2023-04-08 NOTE — ED Notes (Signed)
 Patient is resting comfortably.

## 2023-04-08 NOTE — ED Notes (Signed)
 Visiting with father in the dayroom

## 2023-04-08 NOTE — ED Notes (Signed)
 Patient received Breakfast and lunch Tray

## 2023-04-08 NOTE — ED Notes (Signed)
 Patients fiance is visiting the patient

## 2023-04-08 NOTE — ED Notes (Signed)
 Sherriff en route to transport patient to [facility]. Report called to Annice Pih, Charity fundraiser.

## 2023-04-08 NOTE — ED Notes (Addendum)
 Patient is visiting with his mother in the Dayroom

## 2023-04-09 ENCOUNTER — Encounter (HOSPITAL_COMMUNITY): Payer: Self-pay | Admitting: Psychiatry

## 2023-04-09 ENCOUNTER — Encounter (HOSPITAL_COMMUNITY): Payer: Self-pay

## 2023-04-09 LAB — LIPID PANEL
Cholesterol: 169 mg/dL (ref 0–200)
HDL: 52 mg/dL (ref >40)
LDL Cholesterol: 87 mg/dL (ref 0–99)
Total CHOL/HDL Ratio: 3.3 ratio
Triglycerides: 150 mg/dL — ABNORMAL HIGH (ref <150)
VLDL: 30 mg/dL (ref 0–40)

## 2023-04-09 LAB — HEMOGLOBIN A1C
Hgb A1c MFr Bld: 5.1 % (ref 4.8–5.6)
Mean Plasma Glucose: 99.67 mg/dL

## 2023-04-09 LAB — VITAMIN D 25 HYDROXY (VIT D DEFICIENCY, FRACTURES): Vit D, 25-Hydroxy: 27.24 ng/mL — ABNORMAL LOW (ref 30–100)

## 2023-04-09 LAB — VITAMIN B12: Vitamin B-12: 332 pg/mL (ref 180–914)

## 2023-04-09 LAB — FOLATE: Folate: 11.7 ng/mL (ref >5.9)

## 2023-04-09 MED ORDER — OLANZAPINE 10 MG PO TBDP
10.0000 mg | ORAL_TABLET | Freq: Every day | ORAL | Status: DC
  Filled 2023-04-09: qty 1

## 2023-04-09 MED ORDER — WHITE PETROLATUM EX OINT
TOPICAL_OINTMENT | CUTANEOUS | Status: AC
  Filled 2023-04-09: qty 5

## 2023-04-09 NOTE — Group Note (Signed)
 LLCSW Group Therapy Note   Group Date: 04/09/2023 Start Time: 0100 End Time: 0200   Participation:  patient was present and actively participated in the conversation.  Type of Therapy:  Group Therapy  Topic:  Money Matters: Creating Stability, Confidence and Peace of Mind  Objective:  To help participants understand the impact of financial stability on well-being through the lens of Maslow's Hierarchy of Needs and develop practical strategies for budgeting, saving, and debt repayment.  Goals: Increase awareness of spending habits and financial priorities, recognizing how money supports basic needs, security, and relationships. Develop simple budgeting and saving strategies to enhance stability and peace of mind.  Reduce financial stress by creating a realistic debt repayment plan, supporting long-term confidence and well-being.  Summary:  Participants explored how financial stability connects to basic needs, relationships, and self-esteem using Maslow's Hierarchy. They discussed budgeting, saving, and debt repayment strategies, identifying small, manageable changes. Through interactive discussion and self-reflection, they gained insight into their financial habits and created personal action steps for improvement.  Therapeutic Modalities Used: Elements of Cognitive Behavioral Therapy (CBT) - Addressing financial stress and thought patterns. Psychoeducation - Engineer, agricultural. Elements of Motivational Interviewing (MI) - Encouraging realistic, achievable changes. Group Support - Reducing shame and stress through shared experiences.   Alla Feeling, LCSWA 04/09/2023  6:05 PM

## 2023-04-09 NOTE — [facility] Suicide Risk Assessment (Signed)
 [facility] INPATIENT:  Family/Significant Other Suicide Prevention Education  Suicide Prevention Education:  Education Completed; Bing Neighbors (partner) [phone],  (name of family member/significant other) has been identified by the patient as the family member/significant other with whom the patient will be residing, and identified as the person(s) who will aid the patient in the event of a mental health crisis (suicidal ideations/suicide attempt).  With written consent from the patient, the family member/significant other has been provided the following suicide prevention education, prior to the and/or following the discharge of the patient.  Partner said that they no longer have any weapons or guns, as they got rid of them.  When asked if there are any guns in the home now, she responded, "Absolutely not."  Partner said that she will secure medications in a lockbox, and will secure sharp objects (such as knives and scissors).  When asked if she has any safety concerns about the patient returning home, she said, "Absolutely not."  The suicide prevention education provided includes the following: Suicide risk factors Suicide prevention and interventions National Suicide Hotline telephone number [facility] assessment telephone number [facility] Emergency Assistance 911 [facility] and/or Residential Mobile Crisis Unit telephone number  Request made of family/significant other to: Remove weapons (e.g., guns, rifles, knives), all items previously/currently identified as safety concern.   Remove drugs/medications (over-the-counter, prescriptions, illicit drugs), all items previously/currently identified as a safety concern.  The family member/significant other verbalizes understanding of the suicide prevention education information provided.  The family member/significant other agrees to remove the items of safety concern listed above.  Shephanie Romas O Mikle Sternberg 04/09/2023, 8:02  PM

## 2023-04-09 NOTE — Progress Notes (Signed)
 Patient ID: Michael [name], male   DOB: Oct 04, 1975, 48 y.o.   MRN: [id number]  Patient involuntarily committed after experiencing a manic episode showing aggression and agitation  while in the ED. Patient was pleasant and cooperative during admission process. Patient reports that life in general is stressful. He did appear to display thought blocking and loose associations during the admission process but was pleasant and showed a sense of humor at times.He has no psych  history, UDS + benzos.Skin assessment completed, patient has various tattoos on arms, shoulders and legs. Patient oriented to unit and sandwich tray given. Safety  maintained with 15 min checks.

## 2023-04-09 NOTE — Group Note (Signed)
 Recreation Therapy Group Note   Group Topic:Coping Skills  Group Date: 04/09/2023 Start Time: 1030 End Time: 1050 Facilitators: Madilynne Mullan-McCall, LRT,CTRS Location: [address] Dayroom   Group Topic: Coping Skills   Goal Area(s) Addresses: Patient will define what a coping skill is. Patient will to create a list of healthy coping skills beginning with each letter of the alphabet. Patient will successfully identify positive coping skills they can use post d/c.  Patient will acknowledge benefit(s) of using learned coping skills post d/c.  Intervention: Group work   Activity: Coping A to Z. Patient asked to identify what a coping skill is and when they use them. Patients with Clinical research associate discussed healthy versus unhealthy coping skills. Next patients were given a blank worksheet titled "Coping Skills A-Z". Partners were instructed to come up with at least one positive coping skill per letter of the alphabet. Patients were given 15 minutes to brainstorm before ideas were presented to the large group. Patients and LRT debriefed on the importance of coping skill selection based on situation and back-up plans when a skill tried is not effective. At the end of group, patients were given an handout of alphabetized strategies to keep for future reference.   Education: Pharmacologist, Scientist, physiological, Discharge Planning.    Education Outcome: Acknowledges education/Verbalizes understanding/In group clarification offered/Additional education needed   Affect/Mood: N/A   Participation Level: Did not attend    Clinical Observations/Individualized Feedback:      Plan: Continue to engage patient in RT group sessions 2-3x/week.   Anuradha Chabot-McCall, LRT,CTRS 04/09/2023 1:32 PM

## 2023-04-09 NOTE — Plan of Care (Signed)
   Problem: Activity: Goal: Interest or engagement in activities will improve Outcome: Progressing   Problem: Coping: Goal: Ability to verbalize frustrations and anger appropriately will improve Outcome: Progressing   Problem: Safety: Goal: Periods of time without injury will increase Outcome: Progressing

## 2023-04-09 NOTE — Progress Notes (Signed)
   04/09/23 0800  Psych Admission Type (Psych Patients Only)  Admission Status Involuntary  Psychosocial Assessment  Patient Complaints Worrying  Eye Contact Brief  Facial Expression Anxious  Affect Anxious;Preoccupied  Speech Logical/coherent  Interaction Guarded  Motor Activity Slow  Appearance/Hygiene In scrubs  Behavior Characteristics Cooperative;Appropriate to situation  Mood Anxious;Preoccupied  Aggressive Behavior  Effect No apparent injury  Thought Process  Coherency Circumstantial;Loose associations  Content WDL  Delusions None reported or observed  Perception WDL  Hallucination None reported or observed  Judgment Impaired  Confusion Mild  Danger to Self  Current suicidal ideation? Denies  Danger to Others  Danger to Others None reported or observed
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
## 2023-04-09 NOTE — Tx Team (Signed)
 Initial Treatment Plan 04/09/2023 12:05 AM [name] IRJ:[id number]    PATIENT STRESSORS: Other: Patient reports that life in general is a stressor.     PATIENT STRENGTHS: Ability for insight  Active sense of humor  Motivation for treatment/growth  Supportive family/friends  Work skills    PATIENT IDENTIFIED PROBLEMS: Depression                     DISCHARGE CRITERIA:  Improved stabilization in mood, thinking, and/or behavior  PRELIMINARY DISCHARGE PLAN: Return to previous living arrangement  PATIENT/FAMILY INVOLVEMENT: This treatment plan has been presented to and reviewed with the patient, Michael [name], and/or family member.  The patient and family have been given the opportunity to ask questions and make suggestions.  Floyce Stakes, RN 04/09/2023, 12:05 AM

## 2023-04-09 NOTE — Plan of Care (Signed)
   Problem: Coping: Goal: Ability to demonstrate self-control will improve Outcome: Progressing   Problem: Safety: Goal: Periods of time without injury will increase Outcome: Progressing

## 2023-04-09 NOTE — BH IP Treatment Plan (Unsigned)
 Interdisciplinary Treatment and Diagnostic Plan Update  04/09/2023 Time of Session: 1200PM Donis Pinder IV MRN: [id number]  Principal Diagnosis: Acute psychosis (HCC)  Secondary Diagnoses: Principal Problem:   Acute psychosis (HCC)   Current Medications:  Current Facility-Administered Medications  Medication Dose Route Frequency Provider Last Rate Last Admin   acetaminophen (TYLENOL) tablet 650 mg  650 mg Oral Q6H PRN Motley-Mangrum, Jadeka A, PMHNP       alum & mag hydroxide-simeth (MAALOX/MYLANTA) 200-200-20 MG/5ML suspension 30 mL  30 mL Oral Q4H PRN Motley-Mangrum, Jadeka A, PMHNP       haloperidol (HALDOL) tablet 5 mg  5 mg Oral TID PRN Motley-Mangrum, Jadeka A, PMHNP       And   diphenhydrAMINE (BENADRYL) capsule 50 mg  50 mg Oral TID PRN Motley-Mangrum, Jadeka A, PMHNP       haloperidol lactate (HALDOL) injection 5 mg  5 mg Intramuscular TID PRN Motley-Mangrum, Jadeka A, PMHNP       And   diphenhydrAMINE (BENADRYL) injection 50 mg  50 mg Intramuscular TID PRN Motley-Mangrum, Jadeka A, PMHNP       And   LORazepam (ATIVAN) injection 2 mg  2 mg Intramuscular TID PRN Motley-Mangrum, Jadeka A, PMHNP       haloperidol lactate (HALDOL) injection 10 mg  10 mg Intramuscular TID PRN Motley-Mangrum, Jadeka A, PMHNP       And   diphenhydrAMINE (BENADRYL) injection 50 mg  50 mg Intramuscular TID PRN Motley-Mangrum, Jadeka A, PMHNP       And   LORazepam (ATIVAN) injection 2 mg  2 mg Intramuscular TID PRN Motley-Mangrum, Jadeka A, PMHNP       hydrOXYzine (ATARAX) tablet 25 mg  25 mg Oral TID PRN Motley-Mangrum, Jadeka A, PMHNP       magnesium hydroxide (MILK OF MAGNESIA) suspension 30 mL  30 mL Oral Daily PRN Motley-Mangrum, Geralynn Ochs A, PMHNP       multivitamin with minerals tablet   Oral Daily Motley-Mangrum, Jadeka A, PMHNP   1 tablet at 04/09/23 0804   [START ON 04/10/2023] OLANZapine zydis (ZYPREXA) disintegrating tablet 10 mg  10 mg Oral QHS Peterson Ao, MD       pantoprazole  (PROTONIX) EC tablet 20 mg  20 mg Oral Daily Motley-Mangrum, Jadeka A, PMHNP   20 mg at 04/09/23 0804   rosuvastatin (CRESTOR) tablet 20 mg  20 mg Oral Daily Motley-Mangrum, Jadeka A, PMHNP       traZODone (DESYREL) tablet 50 mg  50 mg Oral QHS PRN Motley-Mangrum, Jadeka A, PMHNP       PTA Medications: Medications Prior to Admission  Medication Sig Dispense Refill Last Dose/Taking   acetaminophen (TYLENOL) 500 MG tablet Take 1,000 mg by mouth every 6 (six) hours as needed.   Past Month   ibuprofen (ADVIL,MOTRIN) 200 MG tablet Take 200 mg by mouth every 6 (six) hours as needed. Takes 600 mg to 800 mg prn   Past Month   loratadine (CLARITIN) 10 MG tablet Take 10 mg by mouth daily as needed for allergies.   Past Month   Multiple Vitamins-Minerals (MULTIVITAMIN ADULT, MINERALS, PO) Take by mouth daily.   Past Month   pantoprazole (PROTONIX) 20 MG tablet Take 20 mg by mouth daily. (Patient taking differently: Take 20 mg by mouth daily as needed.)   Past Month   rosuvastatin (CRESTOR) 20 MG tablet Take 20 mg by mouth daily.   Past Month   buPROPion (ZYBAN) 150 MG 12 hr tablet Take 150 mg by  mouth daily. (Patient not taking: Reported on 04/09/2023)   Not Taking   diphenhydrAMINE-zinc acetate (BENADRYL) cream Apply 1 Application topically daily as needed. (Patient not taking: Reported on 04/09/2023)   Not Taking   escitalopram (LEXAPRO) 10 MG tablet Take 10 mg by mouth daily. Take 1/2 tablet by mouth daily for 8 days then increase to 1 tablet daily (Patient not taking: Reported on 04/09/2023)   Not Taking   hydrOXYzine (ATARAX) 25 MG tablet Take 25 mg by mouth daily as needed for anxiety. (Patient not taking: Reported on 04/09/2023)   Not Taking   oxyCODONE-acetaminophen (PERCOCET/ROXICET) 5-325 MG tablet Take 1-2 tablets by mouth every 4 (four) hours as needed for severe pain. (Patient not taking: Reported on 04/09/2023) 12 tablet 0 Not Taking   tamsulosin (FLOMAX) 0.4 MG CAPS capsule Take 1 capsule (0.4 mg  total) by mouth daily. (Patient not taking: Reported on 04/09/2023) 14 capsule 0 Not Taking    Patient Stressors: Other: Patient reports that life in general is a stressor.    Patient Strengths: Ability for insight  Active sense of humor  Motivation for treatment/growth  Supportive family/friends  Work skills   Treatment Modalities: Medication Management, Group therapy, Case management,  1 to 1 session with clinician, Psychoeducation, Recreational therapy.   Physician Treatment Plan for Primary Diagnosis: Acute psychosis (HCC) Long Term Goal(s):     Short Term Goals:    Medication Management: Evaluate patient's response, side effects, and tolerance of medication regimen.  Therapeutic Interventions: 1 to 1 sessions, Unit Group sessions and Medication administration.  Evaluation of Outcomes: Not Progressing  Physician Treatment Plan for Secondary Diagnosis: Principal Problem:   Acute psychosis (HCC)  Long Term Goal(s):     Short Term Goals:       Medication Management: Evaluate patient's response, side effects, and tolerance of medication regimen.  Therapeutic Interventions: 1 to 1 sessions, Unit Group sessions and Medication administration.  Evaluation of Outcomes: Not Progressing   RN Treatment Plan for Primary Diagnosis: Acute psychosis (HCC) Long Term Goal(s): Knowledge of disease and therapeutic regimen to maintain health will improve  Short Term Goals: Ability to remain free from injury will improve, Ability to verbalize frustration and anger appropriately will improve, Ability to demonstrate self-control, and Ability to participate in decision making will improve  Medication Management: RN will administer medications as ordered by provider, will assess and evaluate patient's response and provide education to patient for prescribed medication. RN will report any adverse and/or side effects to prescribing provider.  Therapeutic Interventions: 1 on 1 counseling  sessions, Psychoeducation, Medication administration, Evaluate responses to treatment, Monitor vital signs and CBGs as ordered, Perform/monitor CIWA, COWS, AIMS and Fall Risk screenings as ordered, Perform wound care treatments as ordered.  Evaluation of Outcomes: Not Progressing   LCSW Treatment Plan for Primary Diagnosis: Acute psychosis (HCC) Long Term Goal(s): Safe transition to appropriate next level of care at discharge, Engage patient in therapeutic group addressing interpersonal concerns.  Short Term Goals: Engage patient in aftercare planning with referrals and resources, Increase social support, Increase ability to appropriately verbalize feelings, Increase emotional regulation, and Facilitate acceptance of mental health diagnosis and concerns  Therapeutic Interventions: Assess for all discharge needs, 1 to 1 time with Social worker, Explore available resources and support systems, Assess for adequacy in community support network, Educate family and significant other(s) on suicide prevention, Complete Psychosocial Assessment, Interpersonal group therapy.  Evaluation of Outcomes: Not Progressing   Progress in Treatment: Attending groups: No. Participating in  groups: No. Taking medication as prescribed: No. Toleration medication: Yes. Family/Significant other contact made: No, will contact:  Pt's wife Bing Neighbors  Patient understands diagnosis: Yes. Discussing patient identified problems/goals with staff: Yes. Medical problems stabilized or resolved: Yes. Denies suicidal/homicidal ideation: Yes. Issues/concerns per patient self-inventory: No. Other: N/a  New problem(s) identified: No, Describe:  None  New Short Term/Long Term Goal(s): medication stabilization, elimination of SI thoughts, development of comprehensive mental wellness plan.   Patient Goals:  "Keep moving forward"  Discharge Plan or Barriers: Patient recently admitted. CSW will continue to follow and  assess for appropriate referrals and possible discharge planning.    Reason for Continuation of Hospitalization: Anxiety Depression Other; describe Mood stabilization, discharge planning  Estimated Length of Stay: 3-5 DAYS  Last 3 [place] Suicide Severity Risk Score: Flowsheet Row [facility] (Current) [facility] from [facility]/2023 in [facility] ED from 10/12/2021 in [facility] Emergency Department at [facility]  C-SSRS RISK CATEGORY No Risk No Risk No Risk       Last PHQ 2/9 Scores:     No data to display          Scribe for Treatment Team: Jacinta Shoe, LCSW 04/09/2023 5:43 PM

## 2023-04-09 NOTE — Progress Notes (Signed)
 Recreation Therapy Notes  INPATIENT RECREATION THERAPY ASSESSMENT  Patient Details Name: Michael [name] MRN: [id number] DOB: 03/10/1975 Today's Date: 04/09/2023       Information Obtained From: Patient  Able to Participate in Assessment/Interview: Yes  Patient Presentation: Alert (Pt would ranble at times during assessment.)  Reason for Admission (Per Patient): Other (Comments) ("do right by myself, get things situated in my head")  Patient Stressors: Other (Comment) ("life")  Coping Skills:   Isolation, Music, Exercise, Talk, Art, Other (Comment), Avoidance, Read Librarian, academic)  Leisure Interests (2+):  [employer], Technical brewer - Other (Comment), Art - Draw, Individual - Other (Comment) (meeting new people; being out in nature)  Frequency of Recreation/Participation: Other (Comment) (Meeting people, Draw, Be outdoors- Daily; Fishing- "would go year round")  Awareness of Community Resources:  Yes  Community Resources:  Other (Comment) ([place]; Arts development officer)  Current Use: Yes  If no, Barriers?:    Expressed Interest in [employer] Information: No  County of Residence:  [place]  Patient Main Form of Transportation: Set designer  Patient Strengths:  People person  Patient Identified Areas of Improvement:  More communication (get better at talking)  Patient Goal for Hospitalization:  "get back out and see family"  Current SI (including self-harm):  No  Current HI:  No  Current AVH: No  Staff Intervention Plan: Group Attendance, Collaborate with Interdisciplinary Treatment Team  Consent to Intern Participation: N/A   Emilie Carp-McCall, LRT,CTRS [name] A [name] 04/09/2023, 1:56 PM
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
## 2023-04-09 NOTE — Group Note (Unsigned)
 Date:  04/09/2023 Time:  9:17 PM  Group Topic/Focus:  Wrap-Up Group:   The focus of this group is to help patients review their daily goal of treatment and discuss progress on daily workbooks.     Participation Level:  {[facility] PARTICIPATION VHQIO:96295}  Participation Quality:  {[facility] PARTICIPATION QUALITY:22265}  Affect:  {[facility] AFFECT:22266}  Cognitive:  {[facility] COGNITIVE:22267}  Insight: {BHH Insight2:20797}  Engagement in Group:  {[facility] ENGAGEMENT IN MWUXL:24401}  Modes of Intervention:  {[facility] MODES OF INTERVENTION:22269}  Additional Comments:  ***  Scot Dock 04/09/2023, 9:17 PM

## 2023-04-09 NOTE — Progress Notes (Addendum)
 Collateral contact - Bing Neighbors (partner) [phone]   When asked what brought patient to the hospital, partner said that he was experiencing "erratic, manic, delusional behavior, which is not typical for him."  He was talking fast, and when partner said something, patient took it the wrong way and walked away in anger.  "I've never seen him act like that."    Partner said that she speaks with him on the phone, and "he sounds so much better; he is slowly coming back to reality.  He definitely wants to get better - I can hear it in his voice." Partner said that she told him, "You are exactly where you need to be for this moment."   Read Drivers, LCSWA 04/09/2023
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
## 2023-04-10 LAB — RPR: RPR Ser Ql: NONREACTIVE

## 2023-04-10 MED ORDER — OLANZAPINE 10 MG PO TABS
20.0000 mg | ORAL_TABLET | Freq: Every day | ORAL | Status: DC
  Administered 2023-04-10 – 2023-04-11 (×2): 20 mg via ORAL
  Filled 2023-04-10 (×4): qty 2

## 2023-04-10 MED ORDER — TRAZODONE HCL 100 MG PO TABS
100.0000 mg | ORAL_TABLET | Freq: Every day | ORAL | Status: DC
Start: 2023-04-10 — End: 2023-04-11
  Filled 2023-04-10 (×2): qty 1

## 2023-04-10 MED ORDER — ZOLPIDEM TARTRATE 5 MG PO TABS: 5.0000 mg | ORAL_TABLET | Freq: Every evening | ORAL | Status: DC | PRN

## 2023-04-10 MED ORDER — VITAMIN D (ERGOCALCIFEROL) 1.25 MG (50000 UNIT) PO CAPS
50000.0000 [IU] | ORAL_CAPSULE | ORAL | Status: DC
  Administered 2023-04-10: 50000 [IU] via ORAL
  Filled 2023-04-10: qty 1

## 2023-04-10 MED ORDER — MENTHOL 3 MG MT LOZG
1.0000 | LOZENGE | OROMUCOSAL | Status: DC | PRN
  Administered 2023-04-10: 3 mg via ORAL
  Filled 2023-04-10: qty 9

## 2023-04-10 MED ORDER — MELATONIN 3 MG PO TABS
3.0000 mg | ORAL_TABLET | Freq: Every day | ORAL | Status: DC
  Administered 2023-04-10: 3 mg via ORAL
  Filled 2023-04-10 (×3): qty 1

## 2023-04-10 MED ORDER — BACITRACIN ZINC 500 UNIT/GM EX OINT
TOPICAL_OINTMENT | Freq: Two times a day (BID) | CUTANEOUS | Status: DC
  Administered 2023-04-10: 1 via TOPICAL
  Administered 2023-04-11 – 2023-04-12 (×3): 31.5556 via TOPICAL
  Administered 2023-04-13: 1 via TOPICAL
  Filled 2023-04-10 (×12): qty 0.9
  Filled 2023-04-10: qty 28.35
  Filled 2023-04-10 (×5): qty 0.9

## 2023-04-10 NOTE — Group Note (Signed)
 Recreation Therapy Group Note   Group Topic:Animal Assisted Therapy   Group Date: 04/10/2023 Start Time: 0945 End Time: 1030 Facilitators: Mackie Goon-McCall, LRT,CTRS Location: [address] Dayroom   Animal-Assisted Activity (AAA) Program Checklist/Progress Notes Patient Eligibility Criteria Checklist & Daily Group note for Rec Tx Intervention  AAA/T Program Assumption of Risk Form signed by Patient/ or Parent Legal Guardian Yes  Patient is free of allergies or severe asthma Yes  Patient reports no fear of animals Yes  Patient reports no history of cruelty to animals Yes  Patient understands his/her participation is voluntary Yes  Patient washes hands before animal contact Yes  Patient washes hands after animal contact Yes  Education: Hand Washing, Appropriate Animal Interaction   Education Outcome: Acknowledges education.    Affect/Mood: Appropriate   Participation Level: Engaged   Participation Quality: Independent   Behavior: Appropriate   Speech/Thought Process: Focused   Insight: Good   Judgement: Good   Modes of Intervention: Teaching laboratory technician   Patient Response to Interventions:  Engaged   Education Outcome:  In group clarification offered    Clinical Observations/Individualized Feedback: Patient attended session and interacted appropriately with therapy dog and peers. Patient asked appropriate questions about therapy dog and his training. Patient shared stories about their pets at home with group.      Plan: Continue to engage patient in RT group sessions 2-3x/week.   Rosalea Withrow-McCall, LRT,CTRS 04/10/2023 2:20 PM

## 2023-04-10 NOTE — Progress Notes (Signed)
 [facility] MD Progress Note  04/10/2023 3:28 PM [name]  MRN:  [id number]  Principal Problem: Acute psychosis (HCC) Diagnosis: Principal Problem:   Acute psychosis (HCC)   Reason for Admission:  Michael [name] is a 48 y.o., male with past psychiatric history of generalized anxiety, history of benzodiazapine abuse , alcohol dependence and history of cocaine use who presents to the [facility] Involuntary from [facility] Emergency Department for evaluation and management of acute psychosis vs acute mania.  (admitted on 04/08/2023, total  LOS: 2 days )  Chart Review from last 24 hours:  The patient's chart was reviewed and nursing notes were reviewed. The patient's case was discussed in multidisciplinary team meeting.   - Overnight events to report per chart review / staff report: no notable overnight events to report - Patient received all scheduled medications - Patient received the following PRN medications: trazodone  and hydroxyzine  Information Obtained Today During Patient Interview: The patient was seen and evaluated on the unit. On assessment today the patient reports that his mood is pretty good.  Patient denies any depression.  Reports depression is a 0 out of 10 with 10 being most severe.  Patient reports some minor anxiety, which she rates at a 4 out of 10.  Patient attributes anxiety is due to being away from home, family and work.  Denies suicidal thoughts, homicidal thoughts and auditory or visual hallucinations.  Patient reports his mom came to visit him yesterday and overall it went well.  Has been speaking on the phone with his significant other to stay optimistic.  Patient is future oriented and thinking about his possible discharge date. Feels supported by family at this time. Patient amenable with staying on 500 hallway and deferring stepping down to less acute unit at this time.   He reports he is attending groups.   Patient endorses fair sleep; endorses  good appetite.  Patient does not endorse any side-effects they attribute to medications.  Past Psychiatric History:   Previous psych diagnoses: Generalized Anxiety, History of Cocaine abuse, History of Benzodiazapine abuse, history of alcohol dependence Prior inpatient psychiatric treatment: Denies Prior outpatient psychiatric treatment:  Unsure  Current psychiatric provider: Denies   Neuromodulation history: denies   Current therapist: Dr. Andrey Campanile, alcohol and drug abuse therapy  Psychotherapy hx:  substance abuse focused therapy    History of suicide attempts: Denies History of homicide: Denies   Psychotropic medications: Current Denies   Past Bupropion per EMR - patient unable to clarify    Substance Use History: Alcohol: a few beers a month,  Hx withdrawal tremors/shakes: does not know Hx alcohol related blackouts: does not know Hx alcohol induced hallucinations: denies Hx alcoholic seizures: does not know, per EMR recently had withdrawal seizure on 03/27/23  Hx medical hospitalization due to severe alcohol withdrawal symptoms: does not know DUI: does not know   --------   Tobacco: denies  Cannabis (marijuana): denies  Cocaine: denies  Methamphetamines: denies  Psilocybin (mushrooms): denies  Ecstasy (MDMA / molly): denies  LSD (acid): denies  Opiates (fentanyl / heroin): denies  Benzos (Xanax, Klonopin):  regularly, Illicit use of klonopin and Xanax for 3 years, has gradually been attempting to wean off to 1-2 grams  [name] drug use: denies Prescribed meds abuse: endorses, benzos , per EMR patient only has 1 fill of Diazepam 5 mg (2 tabs total)    History of detox: no formal detox, attempted self detox with benzos History of rehab: denies Past  Medical History:  Past Medical History:  Diagnosis Date   Anxiety    Back pain    COVID 02/18/2020   mild headache and fatigue x 5 days all symptoms resolved   FH: heart disease    GERD (gastroesophageal reflux disease)     History of kidney stones    Hyperlipidemia    Rectal bleeding    possible hemorrhoid blood on tissue   Right ureteral stone    Family Psychiatric History:  Psych: Unsure  Psych Rx: Unsure  Suicide: Denies Homicide: Denies  Substance use family hx: Denies    Social History:  Place of birth and grew up where: Patient grew up in the [place] area and [place].  He grew up with his mom, dad, and 2 older sisters.  He later moved to [place] and joined [employer]. Abuse: no history of abuse Marital Status:  Patient was married from 2000-2007 and got divorced.  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  Weapons: owns firearms (guns are not properly stored or locked away )  Pills stockpile: Denies   Current Medications: Current Facility-Administered Medications  Medication Dose Route Frequency Provider Last Rate Last Admin   acetaminophen (TYLENOL) tablet 650 mg  650 mg Oral Q6H PRN Motley-Mangrum, Jadeka A, PMHNP       alum & mag hydroxide-simeth (MAALOX/MYLANTA) 200-200-20 MG/5ML suspension 30 mL  30 mL Oral Q4H PRN Motley-Mangrum, Jadeka A, PMHNP       haloperidol (HALDOL) tablet 5 mg  5 mg Oral TID PRN Motley-Mangrum, Jadeka A, PMHNP       And   diphenhydrAMINE (BENADRYL) capsule 50 mg  50 mg Oral TID PRN Motley-Mangrum, Jadeka A, PMHNP       haloperidol lactate (HALDOL) injection 5 mg  5 mg Intramuscular TID PRN Motley-Mangrum, Jadeka A, PMHNP       And   diphenhydrAMINE (BENADRYL) injection 50 mg  50 mg Intramuscular TID PRN Motley-Mangrum, Jadeka A,  PMHNP       And   LORazepam (ATIVAN) injection 2 mg  2 mg Intramuscular TID PRN Motley-Mangrum, Jadeka A, PMHNP       haloperidol lactate (HALDOL) injection 10 mg  10 mg Intramuscular TID PRN Motley-Mangrum, Jadeka A, PMHNP       And   diphenhydrAMINE (BENADRYL) injection 50 mg  50 mg Intramuscular TID PRN Motley-Mangrum, Jadeka A, PMHNP       And   LORazepam (ATIVAN) injection 2 mg  2 mg Intramuscular TID PRN Motley-Mangrum, Jadeka A, PMHNP       hydrOXYzine (ATARAX) tablet 25 mg  25 mg Oral TID PRN Motley-Mangrum, Jadeka A, PMHNP   25 mg at 04/09/23 2108   magnesium hydroxide (MILK OF MAGNESIA) suspension 30 mL  30 mL Oral Daily PRN Motley-Mangrum, Jadeka A, PMHNP       melatonin tablet 3 mg  3 mg Oral QHS Peterson Ao, MD       menthol-cetylpyridinium (CEPACOL) lozenge 3 mg  1 lozenge Oral PRN Peterson Ao, MD       multivitamin with minerals tablet   Oral Daily  Motley-Mangrum, Ezra Sites, PMHNP   1 tablet at 04/10/23 0831   OLANZapine (ZYPREXA) tablet 20 mg  20 mg Oral QHS Golda Acre, MD       traZODone (DESYREL) tablet 100 mg  100 mg Oral QHS Golda Acre, MD       Vitamin D (Ergocalciferol) (DRISDOL) 1.25 MG (50000 UNIT) capsule 50,000 Units  50,000 Units Oral Q7 days Peterson Ao, MD   50,000 Units at 04/10/23 0831   zolpidem (AMBIEN) tablet 5 mg  5 mg Oral QHS PRN Golda Acre, MD        Lab Results:  Results for orders placed or performed during the hospital encounter of 04/08/23 (from the past 48 hours)  Folate     Status: None   Collection Time: 04/09/23  7:19 PM  Result Value Ref Range   Folate 11.7 >5.9 ng/mL    Comment: Performed at [facility], [address] [place], [place] [address]  Hemoglobin A1c     Status: None   Collection Time: 04/09/23  7:19 PM  Result Value Ref Range   Hgb A1c MFr Bld 5.1 4.8 - 5.6 %    Comment: (NOTE) Pre diabetes:          5.7%-6.4%  Diabetes:              >6.4%  Glycemic control for   <7.0% adults  with diabetes    Mean Plasma Glucose 99.67 mg/dL    Comment: Performed at [facility], [address], [place], [place] [address]  Lipid panel     Status: Abnormal   Collection Time: 04/09/23  7:19 PM  Result Value Ref Range   Cholesterol 169 0 - 200 mg/dL   Triglycerides 259 (H) <150 mg/dL   HDL 52 >56 mg/dL   Total CHOL/HDL Ratio 3.3 RATIO   VLDL 30 0 - 40 mg/dL   LDL Cholesterol 87 0 - 99 mg/dL    Comment:        Total Cholesterol/HDL:CHD Risk Coronary Heart Disease Risk Table                     Men   Women  1/2 Average Risk   3.4   3.3  Average Risk       5.0   4.4  2 X Average Risk   9.6   7.1  3 X Average Risk  23.4   11.0        Use the calculated Patient Ratio above and the CHD Risk Table to determine the patient's CHD Risk.        ATP III CLASSIFICATION (LDL):  <100     mg/dL   Optimal  387-564  mg/dL   Near or Above                    Optimal  130-159  mg/dL   Borderline  332-951  mg/dL   High  >884     mg/dL   Very High Performed at [facility], [address], [place], [place] [address]   RPR     Status: None   Collection Time: 04/09/23  7:19 PM  Result Value Ref Range   RPR Ser Ql NON REACTIVE NON REACTIVE    Comment: Performed at [facility], [address] [place], [place] [address]  Vitamin B12     Status: None   Collection Time: 04/09/23  7:19  PM  Result Value Ref Range   Vitamin B-12 332 180 - 914 pg/mL    Comment: (NOTE) This assay is not validated for testing neonatal or myeloproliferative syndrome specimens for Vitamin B12 levels. Performed at [facility], [address] [place], [place] [address]   VITAMIN D 25 Hydroxy (Vit-D Deficiency, Fractures)     Status: Abnormal   Collection Time: 04/09/23  7:19 PM  Result Value Ref Range   Vit D, 25-Hydroxy 27.24 (L) 30 - 100 ng/mL    Comment: (NOTE) Vitamin D deficiency has been defined by the Institute of Medicine  and an Endocrine Society  practice guideline as a level of serum 25-OH  vitamin D less than 20 ng/mL (1,2). The Endocrine Society went on to  further define vitamin D insufficiency as a level between 21 and 29  ng/mL (2).  1. IOM (Institute of Medicine). 2010. Dietary reference intakes for  calcium and D. [place]: [employer]. 2. Holick MF, Binkley Cameron, Bischoff-Ferrari HA, et al. Evaluation,  treatment, and prevention of vitamin D deficiency: an Endocrine  Society clinical practice guideline, JCEM. 2011 Jul; 96(7): 1911-30.  Performed at [facility], [address], [place], [place] [address]     Blood Alcohol level:  Lab Results  Component Value Date   [facility] <10 04/06/2023   ETH <10 04/05/2023    Metabolic Labs: Lab Results  Component Value Date   HGBA1C 5.1 04/09/2023   MPG 99.67 04/09/2023   No results found for: "PROLACTIN" Lab Results  Component Value Date   CHOL 169 04/09/2023   TRIG 150 (H) 04/09/2023   HDL 52 04/09/2023   CHOLHDL 3.3 04/09/2023   VLDL 30 04/09/2023   LDLCALC 87 04/09/2023    Physical Findings: AIMS: No  CIWA:    COWS:     Psychiatric Specialty Exam: General Appearance: Appropriate for Environment; Casual   Eye Contact: Good   Speech: Clear and Coherent; Normal Rate   Volume: Normal   Mood: Euthymic   Affect: Appropriate; Congruent   Thought Content: Logical   Suicidal Thoughts: Suicidal Thoughts: No   Homicidal Thoughts: Homicidal Thoughts: No   Thought Process: Coherent; Goal Directed; Linear   Orientation: Full (Time, Place and Person)     Memory: Immediate Good; Recent Fair; Remote Fair   Judgment: Fair   Insight: Present   Concentration: Fair   Recall: Fair   Fund of Knowledge: Fair   Language: Good   Psychomotor Activity: Psychomotor Activity: Normal   Assets: Communication Skills; Desire for Improvement; Financial Resources/Insurance; Housing; Intimacy; Resilience; Social Support; Transportation;  Vocational/Educational   Sleep: Sleep: Fair Number of Hours of Sleep: 2.25    Review of Systems Review of Systems  Constitutional:  Negative for chills and fever.  Respiratory:  Negative for cough.   Cardiovascular:  Negative for chest pain.  Gastrointestinal:  Negative for nausea and vomiting.  Neurological:  Negative for headaches.  Psychiatric/Behavioral:  Negative for depression, hallucinations, substance abuse and suicidal ideas. The patient is nervous/anxious. The patient does not have insomnia.     Vital Signs: Blood pressure 134/76, pulse 78, temperature 97.8 F (36.6 C), temperature source Oral, resp. rate 20, height 5\' 11"  (1.803 m), weight 87.5 kg, SpO2 100%. Body mass index is 26.92 kg/m. Physical Exam Constitutional:      General: He is in acute distress.     Appearance: Normal appearance. He is not ill-appearing, toxic-appearing or diaphoretic.  Pulmonary:  Effort: Pulmonary effort is normal.  Musculoskeletal:        General: Normal range of motion.  Neurological:     Mental Status: He is alert and oriented to person, place, and time.  Psychiatric:        Behavior: Behavior is not agitated or aggressive. Behavior is cooperative.        Thought Content: Thought content is not paranoid or delusional. Thought content does not include homicidal or suicidal ideation. Thought content does not include homicidal or suicidal plan.        Judgment: Judgment is not inappropriate.     Assets  Assets: Manufacturing systems engineer; Desire for Improvement; Financial Resources/Insurance; Housing; Intimacy; Resilience; Agricultural engineer; Vocational/Educational   Treatment Plan Summary: Daily contact with patient to assess and evaluate symptoms and progress in treatment and Medication management  Diagnoses / Active Problems: Acute psychosis (HCC) Principal Problem:   Acute psychosis (HCC)   ASSESSMENT: Michael [name] is a 48 y.o., male with past psychiatric  history of generalized anxiety, history of benzodiazapine abuse , alcohol dependence and history of cocaine use who presents to the [facility] Involuntary from [facility] Emergency Department for evaluation and management of acute psychosis vs acute mania.    On intake assessment patient was calm, cooperative and engaged throughout the interview.  Patient unable to provide great specifics on events leading up to hospitalization and was vague with some answers.  On further collateral call with information obtained by significant other patient was experiencing symptoms concerning for mania with increased pressured speech, more talking, erratic behavior and some signs of psychosis with delusions where he was talking to people that were not there.  Patient was IVC in the emergency department due to those concerns.  Will uphold the IVC for now and may consider signing the patient in voluntarily at a later time with greater symptom stabilization.  Patient currently on Zyprexa for mood stabilization/improvement of manic like symptoms.   Acute Psychosis  R/o Bipolar Disorder  Generalized Anxiety Disorder History of Benzodiazapine Abuse History Alcohol Dependence  History of Cocaine Use    PLAN: Safety and Monitoring:             -- Involuntary admission to inpatient psychiatric unit for safety, stabilization and treatment             -- Daily contact with patient to assess and evaluate symptoms and progress in treatment             -- Patient's case to be discussed in multi-disciplinary team meeting             -- Observation Level : q15 minute checks             -- Vital signs: q12 hours             -- Precautions: suicide, elopement, and assault   2. Interventions (medications, psychoeducation, etc):               -- Increase Zyprexa to 20 mg at bedtime for psychosis/mania    -- Start Trazodone 100 mg at bedtime for insomnia    -- Start Melatonin 3 mg daily at bedtime for insomnia                -- medical regimen: Continue Protonix 20 mg daily for GERD, continue Crestor 20 mg daily for hyperlipidemia, continue multivitamin daily, Vitamin D 50,000 units every 7 days              --  Patient does not need nicotine replacement   PRN medications for symptomatic management:              --  continue acetaminophen 650 mg every 6 hours as needed for mild to moderate pain, fever, and headaches              -- continue hydroxyzine 25 mg three times a day as needed for anxiety              -- continue ondansetron 8 mg every 8 hours as needed for nausea or vomiting              -- continue aluminum-magnesium hydroxide + simethicone 30 mL every 4 hours as needed for heartburn              -- start Ambien 5 mg at bedtime as needed for insomnia             -- As needed agitation protocol in-place   The risks/benefits/side-effects/alternatives to the above medication were discussed in detail with the patient and time was given for questions. The patient consents to medication trial. FDA black box warnings, if present, were discussed.   The patient is agreeable with the medication plan, as above. We will monitor the patient's response to pharmacologic treatment, and adjust medications as necessary.    3. Routine and other pertinent labs:             -- Metabolic profile:  BMI: Body mass index is 26.92 kg/m.  Prolactin: No results found for: "PROLACTIN"  Lipid Panel: Lab Results  Component Value Date   CHOL 169 04/09/2023   TRIG 150 (H) 04/09/2023   HDL 52 04/09/2023   CHOLHDL 3.3 04/09/2023   VLDL 30 04/09/2023   LDLCALC 87 04/09/2023    HbgA1c: Hgb A1c MFr Bld (%)  Date Value  04/09/2023 5.1    TSH: No results found for: "TSH"  EKG monitoring: QTc: 461  4. Group Therapy:  -- Encouraged patient to participate in unit milieu and in scheduled group therapies   -- Short Term Goals: Ability to identify changes in lifestyle to reduce recurrence of condition,  verbalize feelings, identify and develop effective coping behaviors, maintain clinical measurements within normal limits, and identify triggers associated with substance abuse/mental health issues will improve. Improvement in ability to demonstrate self-control and comply with prescribed medications.  -- Long Term Goals: Improvement in symptoms so as ready for discharge -- Patient is encouraged to participate in group therapy while admitted to the psychiatric unit. -- We will address other chronic and acute stressors, which contributed to the patient's Acute psychosis (HCC) in order to reduce the risk of self-harm at discharge.  5. Discharge Planning:   -- Social work and case management to assist with discharge planning and identification of hospital follow-up needs prior to discharge  -- Estimated LOS: 4-6 more days  -- Discharge Concerns: Need to establish a safety plan; Medication compliance and effectiveness  -- Discharge Goals: Return home with outpatient referrals for mental health follow-up including medication management/psychotherapy  I certify that inpatient services furnished can reasonably be expected to improve the patient's condition.   Signed: Peterson Ao, MD 04/10/2023, 3:28 PM

## 2023-04-10 NOTE — Plan of Care (Signed)

## 2023-04-10 NOTE — Progress Notes (Signed)
   04/10/23 1950  Psych Admission Type (Psych Patients Only)  Admission Status Involuntary  Psychosocial Assessment  Patient Complaints Anxiety  Eye Contact Fair  Facial Expression Anxious  Affect Appropriate to circumstance  Speech Logical/coherent  Interaction Assertive  Motor Activity Other (Comment) (wnl)  Appearance/Hygiene Unremarkable  Behavior Characteristics Cooperative;Appropriate to situation;Anxious  Mood Anxious;Pleasant  Thought Process  Coherency WDL  Content WDL  Delusions None reported or observed  Perception WDL  Hallucination None reported or observed  Judgment Impaired  Confusion None  Danger to Self  Current suicidal ideation? Denies  Self-Injurious Behavior No self-injurious ideation or behavior indicators observed or expressed   Agreement Not to Harm Self Yes  Description of Agreement verbal  Danger to Others  Danger to Others None reported or observed   Progress note   D: Pt seen in his room. Pt denies SI, HI, AVH. Pt contracts for safety. Pt rates pain  0/10. Pt has abrasion to left knee that is clean/dry/intact. Pt endorses anxiety related to being in the hospital. He states he misses his family. Pt states that he does not want to take too many medications. "I want to keep it natural and use natural stuff." Explained pt night medications to him. All questions answered. Endorses some insomnia. Only wants melatonin. No other concerns noted at this time.  A: Pt provided support and encouragement. Pt given scheduled medication as prescribed. PRNs as appropriate. Q15 min checks for safety.   R: Pt safe on the unit. Will continue to monitor.

## 2023-04-10 NOTE — [facility] Group Notes (Signed)
 Adult Psychoeducational Group Note  Date:  04/10/2023 Time:  8:38 PM  Group Topic/Focus:  Wrap-Up Group:   The focus of this group is to help patients review their daily goal of treatment and discuss progress on daily workbooks.  Participation Level:  Active  Participation Quality:  Appropriate  Affect:  Appropriate  Cognitive:  Appropriate  Insight: Appropriate  Engagement in Group:  Engaged  Modes of Intervention:  Discussion  Additional Comments:   Pt states that he's had a good day. Pt got a visit from his father which he enjoyed since his dad is getting older. Pt spoke with his doctors today and went to recreation time and states that he's having optimism about treatment. Pt endorsed anxiety but states its juts due to him being away from his family.    Vevelyn Pat 04/10/2023, 8:38 PM

## 2023-04-10 NOTE — Progress Notes (Signed)
   04/09/23 2200  Psych Admission Type (Psych Patients Only)  Admission Status Involuntary  Psychosocial Assessment  Patient Complaints Anxiety  Eye Contact Brief  Facial Expression Anxious  Affect Appropriate to circumstance  Speech Logical/coherent  Interaction Guarded  Motor Activity Slow  Appearance/Hygiene In scrubs  Behavior Characteristics Cooperative;Appropriate to situation  Mood Anxious  Thought Process  Coherency WDL  Content WDL  Delusions None reported or observed  Perception WDL  Hallucination None reported or observed  Judgment Poor  Confusion Mild  Danger to Self  Current suicidal ideation? Denies  Danger to Others  Danger to Others None reported or observed

## 2023-04-10 NOTE — [facility] Group Notes (Signed)
 Adult Psychoeducational Group Note  Date:  04/10/2023 Time:  4:47 PM  Group Topic/Focus:  Goals Group:   The focus of this group is to help patients establish daily goals to achieve during treatment and discuss how the patient can incorporate goal setting into their daily lives to aide in recovery. Orientation:   The focus of this group is to educate the patient on the purpose and policies of crisis stabilization and provide a format to answer questions about their admission.  The group details unit policies and expectations of patients while admitted.  Participation Level:  Active  Participation Quality:  Appropriate  Affect:  Appropriate  Cognitive:  Appropriate  Insight: Appropriate  Engagement in Group:  Engaged  Modes of Intervention:  Discussion  Additional Comments:  Pt attended the goals group and remained appropriate and engaged throughout the duration of the group.   Sheran Lawless 04/10/2023, 4:47 PM

## 2023-04-10 NOTE — Plan of Care (Signed)
   Problem: Activity: Goal: Interest or engagement in activities will improve Outcome: Progressing   Problem: Coping: Goal: Ability to verbalize frustrations and anger appropriately will improve Outcome: Progressing   Problem: Safety: Goal: Periods of time without injury will increase Outcome: Progressing

## 2023-04-10 NOTE — Progress Notes (Signed)
   04/10/23 0800  Psych Admission Type (Psych Patients Only)  Admission Status Involuntary  Psychosocial Assessment  Patient Complaints Anxiety  Eye Contact Brief  Facial Expression Anxious  Affect Appropriate to circumstance  Speech Logical/coherent  Interaction Guarded  Motor Activity Tremors  Appearance/Hygiene In scrubs  Behavior Characteristics Cooperative;Appropriate to situation  Mood Anxious  Aggressive Behavior  Effect No apparent injury  Thought Process  Coherency WDL  Content WDL  Delusions None reported or observed  Perception WDL  Hallucination None reported or observed  Judgment Poor  Confusion Mild  Danger to Self  Current suicidal ideation? Denies  Danger to Others  Danger to Others None reported or observed

## 2023-04-11 MED ORDER — HYDROXYZINE HCL 10 MG PO TABS
10.0000 mg | ORAL_TABLET | Freq: Three times a day (TID) | ORAL | Status: DC | PRN
  Filled 2023-04-11: qty 1

## 2023-04-11 MED ORDER — TRAZODONE HCL 50 MG PO TABS
50.0000 mg | ORAL_TABLET | Freq: Every day | ORAL | Status: DC
  Filled 2023-04-11 (×3): qty 1

## 2023-04-11 MED ORDER — MELATONIN 5 MG PO TABS
5.0000 mg | ORAL_TABLET | Freq: Every day | ORAL | Status: DC
  Filled 2023-04-11: qty 1

## 2023-04-11 MED ORDER — MELATONIN 3 MG PO TABS
3.0000 mg | ORAL_TABLET | Freq: Every day | ORAL | Status: DC
  Filled 2023-04-11 (×2): qty 1

## 2023-04-11 NOTE — Plan of Care (Signed)
  Problem: Education: Goal: Emotional status will improve Outcome: Progressing Goal: Mental status will improve Outcome: Progressing Goal: Verbalization of understanding the information provided will improve Outcome: Progressing   Problem: Activity: Goal: Interest or engagement in activities will improve Outcome: Progressing Goal: Sleeping patterns will improve Outcome: Progressing   Problem: Coping: Goal: Ability to demonstrate self-control will improve Outcome: Progressing

## 2023-04-11 NOTE — Progress Notes (Signed)
   04/11/23 2135  Psych Admission Type (Psych Patients Only)  Admission Status Involuntary  Psychosocial Assessment  Patient Complaints Anxiety;Depression (related to missing his family and this being first inpatient visit)  Eye Contact Fair  Facial Expression Anxious  Affect Appropriate to circumstance  Speech Logical/coherent  Interaction Assertive  Motor Activity Other (Comment) (wnl)  Appearance/Hygiene Unremarkable  Behavior Characteristics Cooperative;Appropriate to situation;Anxious  Mood Anxious;Depressed;Pleasant  Thought Process  Coherency WDL  Content WDL  Delusions None reported or observed  Perception WDL  Hallucination None reported or observed  Judgment Limited  Confusion None  Danger to Self  Current suicidal ideation? Denies  Self-Injurious Behavior No self-injurious ideation or behavior indicators observed or expressed   Agreement Not to Harm Self Yes  Description of Agreement verbal  Danger to Others  Danger to Others None reported or observed   Progress note   D: Pt seen in dayroom. Pt denies SI, HI, AVH. Pt rates pain  0/10. Abrasion on his knee is clean/dry/intact. Pt endorses some anxiety  and depression r/t missing his family. This is also his first inpatient visit. Pt has concerns about his medications. He wants to take as few pills as possible. Refusing sleep medication even though he hasn't been sleeping well. States he did not sleep well with melatonin last night. Will contact provider. No other concerns noted at this time.  A: Pt provided support and encouragement. Pt given scheduled medication as prescribed. PRNs as appropriate. Q15 min checks for safety.   R: Pt safe on the unit. Will continue to monitor.

## 2023-04-11 NOTE — Progress Notes (Signed)
 [facility] MD Progress Note  04/11/2023 1:28 PM Michael [name]  MRN:  [id number]  Principal Problem: Acute psychosis (HCC) Diagnosis: Principal Problem:   Acute psychosis (HCC)   Reason for Admission:  Michael [name] is a 48 y.o., male with past psychiatric history of generalized anxiety, history of benzodiazapine abuse , alcohol dependence and history of cocaine use who presents to the [facility] Involuntary from [facility] Emergency Department for evaluation and management of acute psychosis vs acute mania.  (admitted on 04/08/2023, total  LOS: 3 days )  Chart Review from last 24 hours:  The patient's chart was reviewed and nursing notes were reviewed. The patient's case was discussed in multidisciplinary team meeting.   - Overnight events to report per chart review / staff report: no notable overnight events to report - Patient received all scheduled medications - Patient received the following PRN medications: trazodone  and hydroxyzine  Information Obtained Today During Patient Interview: The patient was seen and evaluated on the unit. On assessment today the patient reports that his mood is pretty good.  Patient denies any depression.  Reports depression is a 0 out of 10 with 10 being most severe.  Patient reports some minor anxiety, which she rates at a 2 out of 10.  Patient attributes anxiety is due to being away from family.  Denies suicidal thoughts, homicidal thoughts and auditory or visual hallucinations.  Patient reports his dad came to visit him yesterday and overall it went well.  Has been speaking on the phone with his significant other to stay optimistic.  Patient is future oriented and thinking about his possible discharge date. Feels supported by family at this time. Patient reports dry mouth, which impacted his sleep after increasing trazodone yesterday. Patient requested adjusting back to prior dose to improved sleep.    He reports he is attending groups.    Patient endorses fair sleep; endorses good appetite.  Past Psychiatric History:   Previous psych diagnoses: Generalized Anxiety, History of Cocaine abuse, History of Benzodiazapine abuse, history of alcohol dependence Prior inpatient psychiatric treatment: Denies Prior outpatient psychiatric treatment:  Unsure  Current psychiatric provider: Denies   Neuromodulation history: denies   Current therapist: Dr. Andrey Campanile, alcohol and drug abuse therapy  Psychotherapy hx:  substance abuse focused therapy    History of suicide attempts: Denies History of homicide: Denies   Psychotropic medications: Current Denies   Past Bupropion per EMR - patient unable to clarify    Substance Use History: Alcohol: a few beers a month,  Hx withdrawal tremors/shakes: does not know Hx alcohol related blackouts: does not know Hx alcohol induced hallucinations: denies Hx alcoholic seizures: does not know, per EMR recently had withdrawal seizure on 03/27/23  Hx medical hospitalization due to severe alcohol withdrawal symptoms: does not know DUI: does not know   --------   Tobacco: denies  Cannabis (marijuana): denies  Cocaine: denies  Methamphetamines: denies  Psilocybin (mushrooms): denies  Ecstasy (MDMA / molly): denies  LSD (acid): denies  Opiates (fentanyl / heroin): denies  Benzos (Xanax, Klonopin):  regularly, Illicit use of klonopin and Xanax for 3 years, has gradually been attempting to wean off to 1-2 grams  [name] drug use: denies Prescribed meds abuse: endorses, benzos , per EMR patient only has 1 fill of Diazepam 5 mg (2 tabs total)    History of detox: no formal detox, attempted self detox with benzos History of rehab: denies Past Medical History:  Past Medical History:  Diagnosis Date  Anxiety    Back pain    COVID 02/18/2020   mild headache and fatigue x 5 days all symptoms resolved   FH: heart disease    GERD (gastroesophageal reflux disease)    History of kidney stones     Hyperlipidemia    Rectal bleeding    possible hemorrhoid blood on tissue   Right ureteral stone    Family Psychiatric History:  Psych: Unsure  Psych Rx: Unsure  Suicide: Denies Homicide: Denies  Substance use family hx: Denies    Social History:  Place of birth and grew up where: Patient [place] up in the [place] area and [place].  He grew up with his mom, dad, and 2 older sisters.  He later moved to [facility] and joined [employer]. Abuse: no history of abuse Marital Status:  Patient was married from 2000-2007 and got divorced.  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  Weapons: owns firearms (guns are not properly stored or locked away )  Pills stockpile: Denies   Current Medications: Current Facility-Administered Medications  Medication Dose Route Frequency Provider Last Rate Last Admin   acetaminophen (TYLENOL) tablet 650 mg  650 mg Oral Q6H PRN Motley-Mangrum, Jadeka A, PMHNP       alum & mag hydroxide-simeth (MAALOX/MYLANTA) 200-200-20 MG/5ML suspension 30 mL  30 mL Oral Q4H PRN Motley-Mangrum, Geralynn Ochs A, PMHNP       bacitracin ointment   Topical BID Peterson Ao, MD   [phone] Application at 04/11/23 [phone]   haloperidol (HALDOL) tablet 5 mg  5 mg Oral TID PRN Motley-Mangrum, Jadeka A, PMHNP       And   diphenhydrAMINE (BENADRYL) capsule 50 mg  50 mg Oral TID PRN Motley-Mangrum, Jadeka A, PMHNP       haloperidol lactate (HALDOL) injection 5 mg  5 mg Intramuscular TID PRN Motley-Mangrum, Jadeka A, PMHNP       And   diphenhydrAMINE (BENADRYL)  injection 50 mg  50 mg Intramuscular TID PRN Motley-Mangrum, Jadeka A, PMHNP       And   LORazepam (ATIVAN) injection 2 mg  2 mg Intramuscular TID PRN Motley-Mangrum, Jadeka A, PMHNP       haloperidol lactate (HALDOL) injection 10 mg  10 mg Intramuscular TID PRN Motley-Mangrum, Jadeka A, PMHNP       And   diphenhydrAMINE (BENADRYL) injection 50 mg  50 mg Intramuscular TID PRN Motley-Mangrum, Jadeka A, PMHNP       And   LORazepam (ATIVAN) injection 2 mg  2 mg Intramuscular TID PRN Motley-Mangrum, Jadeka A, PMHNP       hydrOXYzine (ATARAX) tablet 25 mg  25 mg Oral TID PRN Motley-Mangrum, Jadeka A, PMHNP   25 mg at 04/09/23 2108   magnesium hydroxide (MILK OF MAGNESIA) suspension 30 mL  30 mL Oral Daily PRN Motley-Mangrum, Jadeka A, PMHNP       melatonin tablet 3 mg  3 mg Oral QHS Peterson Ao, MD       menthol-cetylpyridinium (CEPACOL) lozenge 3 mg  1 lozenge Oral PRN Peterson Ao, MD   3 mg at 04/10/23 2040  multivitamin with minerals tablet   Oral Daily Motley-Mangrum, Jadeka A, PMHNP   1 tablet at 04/11/23 0811   OLANZapine (ZYPREXA) tablet 20 mg  20 mg Oral QHS Golda Acre, MD   20 mg at 04/10/23 2039   traZODone (DESYREL) tablet 50 mg  50 mg Oral QHS Peterson Ao, MD       Vitamin D (Ergocalciferol) (DRISDOL) 1.25 MG (50000 UNIT) capsule 50,000 Units  50,000 Units Oral Q7 days Peterson Ao, MD   50,000 Units at 04/10/23 0831   zolpidem (AMBIEN) tablet 5 mg  5 mg Oral QHS PRN Golda Acre, MD        Lab Results:  Results for orders placed or performed during the hospital encounter of 04/08/23 (from the past 48 hours)  Folate     Status: None   Collection Time: 04/09/23  7:19 PM  Result Value Ref Range   Folate 11.7 >5.9 ng/mL    Comment: Performed at [facility], [address], [place], [place] [address]  Hemoglobin A1c     Status: None   Collection Time: 04/09/23  7:19 PM  Result Value Ref Range   Hgb A1c MFr Bld 5.1 4.8 - 5.6 %    Comment:  (NOTE) Pre diabetes:          5.7%-6.4%  Diabetes:              >6.4%  Glycemic control for   <7.0% adults with diabetes    Mean Plasma Glucose 99.67 mg/dL    Comment: Performed at [facility], [address], [place], [place] [address]  Lipid panel     Status: Abnormal   Collection Time: 04/09/23  7:19 PM  Result Value Ref Range   Cholesterol 169 0 - 200 mg/dL   Triglycerides 846 (H) <150 mg/dL   HDL 52 >96 mg/dL   Total CHOL/HDL Ratio 3.3 RATIO   VLDL 30 0 - 40 mg/dL   LDL Cholesterol 87 0 - 99 mg/dL    Comment:        Total Cholesterol/HDL:CHD Risk Coronary Heart Disease Risk Table                     Men   Women  1/2 Average Risk   3.4   3.3  Average Risk       5.0   4.4  2 X Average Risk   9.6   7.1  3 X Average Risk  23.4   11.0        Use the calculated Patient Ratio above and the CHD Risk Table to determine the patient's CHD Risk.        ATP III CLASSIFICATION (LDL):  <100     mg/dL   Optimal  295-284  mg/dL   Near or Above                    Optimal  130-159  mg/dL   Borderline  132-440  mg/dL   High  >102     mg/dL   Very High Performed at [facility], [address], [place], [place] [address]   RPR     Status: None   Collection Time: 04/09/23  7:19 PM  Result Value Ref Range   RPR Ser Ql NON REACTIVE NON REACTIVE    Comment: Performed at [facility], [address], [place], [place] [address]  Vitamin B12  Status: None   Collection Time: 04/09/23  7:19 PM  Result Value Ref Range   Vitamin B-12 332 180 - 914 pg/mL    Comment: (NOTE) This assay is not validated for testing neonatal or myeloproliferative syndrome specimens for Vitamin B12 levels. Performed at [facility], [address], [place], [place] [address]   VITAMIN D 25 Hydroxy (Vit-D Deficiency, Fractures)     Status: Abnormal   Collection Time: 04/09/23  7:19 PM  Result Value Ref Range   Vit D, 25-Hydroxy 27.24 (L) 30 - 100 ng/mL     Comment: (NOTE) Vitamin D deficiency has been defined by the Institute of Medicine  and an Endocrine Society practice guideline as a level of serum 25-OH  vitamin D less than 20 ng/mL (1,2). The Endocrine Society went on to  further define vitamin D insufficiency as a level between 21 and 29  ng/mL (2).  1. IOM (Institute of Medicine). 2010. Dietary reference intakes for  calcium and D. [place]: [employer]. 2. Holick MF, Binkley [place], Bischoff-Ferrari HA, et al. Evaluation,  treatment, and prevention of vitamin D deficiency: an Endocrine  Society clinical practice guideline, JCEM. 2011 Jul; 96(7): 1911-30.  Performed at [facility], [address] [place], [place] [address]     Blood Alcohol level:  Lab Results  Component Value Date   [facility] <10 04/06/2023   ETH <10 04/05/2023    Metabolic Labs: Lab Results  Component Value Date   HGBA1C 5.1 04/09/2023   MPG 99.67 04/09/2023   No results found for: "PROLACTIN" Lab Results  Component Value Date   CHOL 169 04/09/2023   TRIG 150 (H) 04/09/2023   HDL 52 04/09/2023   CHOLHDL 3.3 04/09/2023   VLDL 30 04/09/2023   LDLCALC 87 04/09/2023    Physical Findings: AIMS: No  CIWA:    COWS:     Psychiatric Specialty Exam: General Appearance: Appropriate for Environment; Casual   Eye Contact: Good   Speech: Clear and Coherent; Normal Rate   Volume: Normal   Mood: Euthymic   Affect: Appropriate; Congruent   Thought Content: Logical   Suicidal Thoughts: Suicidal Thoughts: No   Homicidal Thoughts: Homicidal Thoughts: No   Thought Process: Coherent; Goal Directed; Linear   Orientation: Full (Time, Place and Person)     Memory: Immediate Good; Recent Good   Judgment: Fair   Insight: Fair   Concentration: Good   Recall: Fair   Fund of Knowledge: Fair   Language: Good   Psychomotor Activity: Psychomotor Activity: Normal   Assets: Communication Skills; Desire for Improvement;  Housing; Social Support; Physical Health; Intimacy; Financial Resources/Insurance   Sleep: Sleep: Fair Number of Hours of Sleep: 5.25    Review of Systems Review of Systems  Constitutional:  Negative for chills and fever.  Respiratory:  Negative for cough.   Cardiovascular:  Negative for chest pain.  Gastrointestinal:  Negative for nausea and vomiting.  Neurological:  Negative for headaches.  Psychiatric/Behavioral:  Negative for depression, hallucinations, substance abuse and suicidal ideas. The patient is nervous/anxious. The patient does not have insomnia.     Vital Signs: Blood pressure (!) 142/91, pulse (!) 42, temperature 97.6 F (36.4 C), temperature source Oral, resp. rate 20, height 5\' 11"  (1.803 m), weight 87.5 kg, SpO2 100%. Body mass index is 26.92 kg/m. Physical Exam Constitutional:      General: He is not in acute distress.    Appearance: Normal appearance. He is not ill-appearing,  toxic-appearing or diaphoretic.  Pulmonary:     Effort: Pulmonary effort is normal.  Musculoskeletal:        General: Normal range of motion.  Neurological:     Mental Status: He is alert and oriented to person, place, and time.  Psychiatric:        Behavior: Behavior is not agitated or aggressive. Behavior is cooperative.        Thought Content: Thought content is not paranoid or delusional. Thought content does not include homicidal or suicidal ideation. Thought content does not include homicidal or suicidal plan.        Judgment: Judgment is not inappropriate.     Assets  Assets: Manufacturing systems engineer; Desire for Improvement; Housing; Social Support; Physical Health; Intimacy; Financial Resources/Insurance   Treatment Plan Summary: Daily contact with patient to assess and evaluate symptoms and progress in treatment and Medication management  Diagnoses / Active Problems: Acute psychosis (HCC) Principal Problem:   Acute psychosis (HCC)   ASSESSMENT: Michael [name] is a 48  y.o., male with past psychiatric history of generalized anxiety, history of benzodiazapine abuse , alcohol dependence and history of cocaine use who presents to the [facility]voluntary from [facility] Emergency Department for evaluation and management of acute psychosis vs acute mania.    On intake assessment patient was calm, cooperative and engaged throughout the interview.  Patient under IVC. Patient continues to do well on Zyprexa for mood stabilization/improvement of manic like symptoms. Has continued to speak with family members and he is approaching his baseline per their report. Anticipate discharge on Friday.    Acute Psychosis  R/o Bipolar Disorder  Generalized Anxiety Disorder History of Benzodiazapine Abuse History Alcohol Dependence  History of Cocaine Use    PLAN: Safety and Monitoring:             -- Involuntary admission to inpatient psychiatric unit for safety, stabilization and treatment             -- Daily contact with patient to assess and evaluate symptoms and progress in treatment             -- Patient's case to be discussed in multi-disciplinary team meeting             -- Observation Level : q15 minute checks             -- Vital signs: q12 hours             -- Precautions: suicide, elopement, and assault   2. Interventions (medications, psychoeducation, etc):               -- Continue Zyprexa to 20 mg at bedtime for psychosis/mania    -- Decrease Trazodone 50 mg at bedtime for insomnia    -- Continue Melatonin 3 mg daily at bedtime for insomnia               -- medical regimen: Continue Protonix 20 mg daily for GERD, continue Crestor 20 mg daily for hyperlipidemia, continue multivitamin daily, Vitamin D 50,000 units every 7 days              -- Patient does not need nicotine replacement   PRN medications for symptomatic management:              --  continue acetaminophen 650 mg every 6 hours as needed for mild to moderate pain, fever, and  headaches              --  continue hydroxyzine 25 mg three times a day as needed for anxiety              -- continue ondansetron 8 mg every 8 hours as needed for nausea or vomiting              -- continue aluminum-magnesium hydroxide + simethicone 30 mL every 4 hours as needed for heartburn              -- start Ambien 5 mg at bedtime as needed for insomnia             -- As needed agitation protocol in-place   The risks/benefits/side-effects/alternatives to the above medication were discussed in detail with the patient and time was given for questions. The patient consents to medication trial. FDA black box warnings, if present, were discussed.   The patient is agreeable with the medication plan, as above. We will monitor the patient's response to pharmacologic treatment, and adjust medications as necessary.    3. Routine and other pertinent labs:             -- Metabolic profile:  BMI: Body mass index is 26.92 kg/m.  Prolactin: No results found for: "PROLACTIN"  Lipid Panel: Lab Results  Component Value Date   CHOL 169 04/09/2023   TRIG 150 (H) 04/09/2023   HDL 52 04/09/2023   CHOLHDL 3.3 04/09/2023   VLDL 30 04/09/2023   LDLCALC 87 04/09/2023    HbgA1c: Hgb A1c MFr Bld (%)  Date Value  04/09/2023 5.1    TSH: No results found for: "TSH"  EKG monitoring: QTc: 461  4. Group Therapy:  -- Encouraged patient to participate in unit milieu and in scheduled group therapies   -- Short Term Goals: Ability to identify changes in lifestyle to reduce recurrence of condition, verbalize feelings, identify and develop effective coping behaviors, maintain clinical measurements within normal limits, and identify triggers associated with substance abuse/mental health issues will improve. Improvement in ability to demonstrate self-control and comply with prescribed medications.  -- Long Term Goals: Improvement in symptoms so as ready for discharge -- Patient is encouraged to  participate in group therapy while admitted to the psychiatric unit. -- We will address other chronic and acute stressors, which contributed to the patient's Acute psychosis (HCC) in order to reduce the risk of self-harm at discharge.  5. Discharge Planning:   -- Social work and case management to assist with discharge planning and identification of hospital follow-up needs prior to discharge  -- Estimated LOS: 1-2 more days  -- Discharge Concerns: Need to establish a safety plan; Medication compliance and effectiveness  -- Discharge Goals: Return home with outpatient referrals for mental health follow-up including medication management/psychotherapy  I certify that inpatient services furnished can reasonably be expected to improve the patient's condition.   Signed: Peterson Ao, MD 04/11/2023, 1:28 PM

## 2023-04-11 NOTE — Progress Notes (Signed)
   04/11/23 0600  15 Minute Checks  Location Bedroom  Visual Appearance Calm  Behavior Sleeping  Sleep (Behavioral Health Patients Only)  Calculate sleep? (Click Yes once per 24 hr at 0600 safety check) Yes  Documented sleep last 24 hours 5.25

## 2023-04-11 NOTE — Plan of Care (Signed)
   Problem: Education: Goal: Emotional status will improve Outcome: Progressing Goal: Mental status will improve Outcome: Progressing

## 2023-04-11 NOTE — Progress Notes (Signed)
   04/11/23 0800  Psych Admission Type (Psych Patients Only)  Admission Status Involuntary  Psychosocial Assessment  Patient Complaints Anxiety  Eye Contact Fair  Facial Expression Anxious  Affect Appropriate to circumstance  Speech Logical/coherent  Interaction Assertive  Motor Activity Other (Comment) (wnl)  Appearance/Hygiene Unremarkable  Behavior Characteristics Cooperative;Appropriate to situation  Mood Anxious;Pleasant;Euthymic  Thought Process  Coherency WDL  Content WDL  Delusions None reported or observed  Perception WDL  Hallucination None reported or observed  Judgment Limited  Confusion None  Danger to Self  Current suicidal ideation? Denies  Self-Injurious Behavior No self-injurious ideation or behavior indicators observed or expressed   Agreement Not to Harm Self Yes  Description of Agreement Verbal  Danger to Others  Danger to Others None reported or observed

## 2023-04-11 NOTE — Group Note (Signed)
 Recreation Therapy Group Note   Group Topic:Stress Management  Group Date: 04/11/2023 Start Time: 1009 End Time: 1029 Facilitators: Marjete Drewey Begue-McCall, LRT,CTRS Location: 500 Hall Dayroom   Group Topic: Stress Management  Goal Area(s) Addresses:  Patient will identify positive stress management techniques. Patient will identify benefits of using stress management post d/c.  Intervention: Calm App  Activity:  Meditation. LRT and patients went through a series of stretches to loosen and relax the muscles. LRT then played a meditation that focused on being resilient in the face of adversity. Patients were encouraged to sit back, relax and focus on their breathing to give full attention to the meditation as it played.   Education:  Stress Management, Discharge Planning.   Education Outcome: Acknowledges Education   Affect/Mood: Appropriate   Participation Level: Minimal   Participation Quality: Independent   Behavior: On-looking   Speech/Thought Process: Unfocused   Insight: None   Judgement: None   Modes of Intervention: App   Patient Response to Interventions:  Disengaged   Education Outcome:  In group clarification offered    Clinical Observations/Individualized Feedback: Pt came into group as meditation played. Pt started off looking out the window and appeared to be listening. Pt eventually started reading his admission packet instead of focusing on the meditation.    Plan: Continue to engage patient in RT group sessions 2-3x/week.   Shirlene Andaya-McCall, LRT,CTRS 04/11/2023 12:22 PM

## 2023-04-11 NOTE — [facility] Group Notes (Signed)
 Adult Psychoeducational Group Note  Date:  04/11/2023 Time:  6:45 PM  Group Topic/Focus:  Goals Group:   The focus of this group is to help patients establish daily goals to achieve during treatment and discuss how the patient can incorporate goal setting into their daily lives to aide in recovery. Orientation:   The focus of this group is to educate the patient on the purpose and policies of crisis stabilization and provide a format to answer questions about their admission.  The group details unit policies and expectations of patients while admitted.  Participation Level:  Active  Participation Quality:  Appropriate  Affect:  Appropriate  Cognitive:  Appropriate  Insight: Appropriate  Engagement in Group:  Engaged  Modes of Intervention:  Discussion  Additional Comments:  Pt attended the goals group and remained appropriate and engaged throughout the duration of the group.   Sheran Lawless 04/11/2023, 6:45 PM

## 2023-04-11 NOTE — Progress Notes (Signed)
 Modified orders from provider for hydroxyzine. Asked pt if he would like to try it tonight to assist with anxiety and insomnia. "I think I'll try it tomorrow." Will continue to monitor.

## 2023-04-12 MED ORDER — PRAZOSIN HCL 1 MG PO CAPS
1.0000 mg | ORAL_CAPSULE | Freq: Every day | ORAL | Status: DC
Start: 2023-04-12 — End: 2023-04-13
  Administered 2023-04-12: 1 mg via ORAL
  Filled 2023-04-12 (×2): qty 1

## 2023-04-12 MED ORDER — OLANZAPINE 7.5 MG PO TABS
15.0000 mg | ORAL_TABLET | Freq: Every day | ORAL | Status: DC
  Administered 2023-04-12: 15 mg via ORAL
  Filled 2023-04-12 (×2): qty 2

## 2023-04-12 NOTE — Plan of Care (Signed)
   Problem: Education: Goal: Emotional status will improve Outcome: Progressing Goal: Mental status will improve Outcome: Progressing Goal: Verbalization of understanding the information provided will improve Outcome: Progressing   Problem: Activity: Goal: Interest or engagement in activities will improve Outcome: Progressing Goal: Sleeping patterns will improve Outcome: Progressing

## 2023-04-12 NOTE — Progress Notes (Signed)
 [facility] MD Progress Note  04/12/2023 2:56 PM Michael [name]  MRN:  [id number]  Principal Problem: Acute psychosis (HCC) Diagnosis: Principal Problem:   Acute psychosis (HCC)   Reason for Admission:  Michael [name] is a 48 y.o., male with past psychiatric history of generalized anxiety, history of benzodiazapine abuse , alcohol dependence and history of cocaine use who presents to the [facility] Involuntary from [facility] Emergency Department for evaluation and management of acute psychosis vs acute mania.  (admitted on 04/08/2023, total  LOS: 4 days )  Chart Review from last 24 hours:  The patient's chart was reviewed and nursing notes were reviewed. The patient's case was discussed in multidisciplinary team meeting.   - Overnight events to report per chart review / staff report: no notable overnight events to report - Patient received all scheduled medications - Patient received the following PRN medications: tylenol   Information Obtained Today During Patient Interview: The patient was seen and evaluated on the unit. On assessment today the patienteports that his mood is good.  She reports that his mood is good, and that staff is great and friendly.  Denies any depressive symptoms currently.  Rates anxiety at 5 out of 10, with 10 being most severe due to being away from family and in the hospital.  He denies any SI, HI, AVH.  Patient reports that conversations with Michael and family have been positive.  Patient is requesting outpatient resources for therapy and medication management for discharge planning.  [facility]  He prefers the impression option if possible. Regarding medications patient reports increased dry mouth while on Zyprexa.  Collateral Information,Michael [name], Significant other, 04/12/2023 at 10:20am regarding discharge.   She states that her visits have been going great and the patient feels  and sounds "more like himself". She has no complaints or concerns with patient coming home. Denies any safety concerns with discharge. She says that patient claims that he is returning back to baseline as well and that patient has really had a great time with the treatment and counseling provided here. She is able to pickup patient tomorrow (04/13/2023) at 1 pm.  - Michael [name] (Medical Student)   Past Psychiatric History:   Previous psych diagnoses: Generalized Anxiety, History of Cocaine abuse, History of Benzodiazapine abuse, history of alcohol dependence Prior inpatient psychiatric treatment: Denies Prior outpatient psychiatric treatment:  Unsure  Current psychiatric provider: Denies   Neuromodulation history: denies   Current therapist: Dr. Andrey Campanile, alcohol and drug abuse therapy  Psychotherapy hx:  substance abuse focused therapy    History of suicide attempts: Denies History of homicide: Denies   Psychotropic medications: Current Denies   Past Bupropion per EMR - patient unable to clarify    Substance Use History: Alcohol: a few beers a month,  Hx withdrawal tremors/shakes: does not know Hx alcohol related blackouts: does not know Hx alcohol induced hallucinations: denies Hx alcoholic seizures: does not know, per EMR recently had withdrawal seizure on 03/27/23  Hx medical hospitalization due to severe alcohol withdrawal symptoms: does not know DUI: does not know   --------   Tobacco: denies  Cannabis (marijuana): denies  Cocaine: denies  Methamphetamines: denies  Psilocybin (mushrooms): denies  Ecstasy (MDMA / molly): denies  LSD (acid): denies  Opiates (fentanyl / heroin): denies  Benzos (Xanax, Klonopin):  regularly, Illicit use of klonopin and Xanax for 3 years, has gradually been attempting to wean off to  1-2 grams  [name] drug use: denies Prescribed meds abuse: endorses, benzos , per EMR patient only has 1 fill of Diazepam 5 mg (2 tabs total)    History of detox:  no formal detox, attempted self detox with benzos History of rehab: denies Past Medical History:  Past Medical History:  Diagnosis Date   Anxiety    Back pain    COVID 02/18/2020   mild headache and fatigue x 5 days all symptoms resolved   FH: heart disease    GERD (gastroesophageal reflux disease)    History of kidney stones    Hyperlipidemia    Rectal bleeding    possible hemorrhoid blood on tissue   Right ureteral stone    Family Psychiatric History:  Psych: Unsure  Psych Rx: Unsure  Suicide: Denies Homicide: Denies  Substance use family hx: Denies    Social History:  Place of birth and grew up where: Patient grew up in the [place] area and [place].  He grew up with his mom, dad, and 2 older sisters.  He later moved to [facility] [place] [facility] and joined [employer]. Abuse: no history of abuse Marital Status:  Patient was married from 2000-2007 and got divorced.  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  Weapons: owns firearms (guns are not properly stored or locked away )  Pills stockpile: Denies   Current Medications: Current Facility-Administered Medications  Medication Dose Route Frequency Provider Last Rate Last Admin   acetaminophen (TYLENOL) tablet 650 mg  650 mg Oral Q6H PRN Motley-Mangrum, Jadeka A, PMHNP   650 mg at 04/11/23 1420   alum & mag hydroxide-simeth (MAALOX/MYLANTA) 200-200-20 MG/5ML suspension 30 mL  30 mL Oral Q4H PRN Motley-Mangrum, Geralynn Ochs A, PMHNP       bacitracin ointment   Topical BID Peterson Ao, MD   [phone] Application at  04/12/23 0820   haloperidol (HALDOL) tablet 5 mg  5 mg Oral TID PRN Motley-Mangrum, Jadeka A, PMHNP       And   diphenhydrAMINE (BENADRYL) capsule 50 mg  50 mg Oral TID PRN Motley-Mangrum, Jadeka A, PMHNP       haloperidol lactate (HALDOL) injection 5 mg  5 mg Intramuscular TID PRN Motley-Mangrum, Jadeka A, PMHNP       And   diphenhydrAMINE (BENADRYL) injection 50 mg  50 mg Intramuscular TID PRN Motley-Mangrum, Jadeka A, PMHNP       And   LORazepam (ATIVAN) injection 2 mg  2 mg Intramuscular TID PRN Motley-Mangrum, Jadeka A, PMHNP       haloperidol lactate (HALDOL) injection 10 mg  10 mg Intramuscular TID PRN Motley-Mangrum, Jadeka A, PMHNP       And   diphenhydrAMINE (BENADRYL) injection 50 mg  50 mg Intramuscular TID PRN Motley-Mangrum, Jadeka A, PMHNP       And   LORazepam (ATIVAN) injection 2 mg  2 mg Intramuscular TID PRN Motley-Mangrum, Jadeka A, PMHNP       hydrOXYzine (ATARAX) tablet 10 mg  10 mg Oral TID PRN Onuoha, Chinwendu V, NP       magnesium hydroxide (MILK OF MAGNESIA)  suspension 30 mL  30 mL Oral Daily PRN Motley-Mangrum, Jadeka A, PMHNP       menthol-cetylpyridinium (CEPACOL) lozenge 3 mg  1 lozenge Oral PRN Peterson Ao, MD   3 mg at 04/10/23 2040   multivitamin with minerals tablet   Oral Daily Motley-Mangrum, Jadeka A, PMHNP   1 tablet at 04/12/23 0820   OLANZapine (ZYPREXA) tablet 15 mg  15 mg Oral QHS Peterson Ao, MD       prazosin (MINIPRESS) capsule 1 mg  1 mg Oral QHS Golda Acre, MD       traZODone (DESYREL) tablet 50 mg  50 mg Oral QHS Peterson Ao, MD       Vitamin D (Ergocalciferol) (DRISDOL) 1.25 MG (50000 UNIT) capsule 50,000 Units  50,000 Units Oral Q7 days Peterson Ao, MD   50,000 Units at 04/10/23 0831   zolpidem (AMBIEN) tablet 5 mg  5 mg Oral QHS PRN Golda Acre, MD        Lab Results:  No results found for this or any previous visit (from the past 48 hours).   Blood Alcohol level:  Lab Results  Component Value Date   ETH <10  04/06/2023   ETH <10 04/05/2023    Metabolic Labs: Lab Results  Component Value Date   HGBA1C 5.1 04/09/2023   MPG 99.67 04/09/2023   No results found for: "PROLACTIN" Lab Results  Component Value Date   CHOL 169 04/09/2023   TRIG 150 (H) 04/09/2023   HDL 52 04/09/2023   CHOLHDL 3.3 04/09/2023   VLDL 30 04/09/2023   LDLCALC 87 04/09/2023    Physical Findings: AIMS: No  CIWA:    COWS:     Psychiatric Specialty Exam: General Appearance: Appropriate for Environment; Casual   Eye Contact: Good   Speech: Normal Rate; Clear and Coherent   Volume: Normal   Mood: Euthymic   Affect: Congruent; Appropriate   Thought Content: Logical   Suicidal Thoughts: Suicidal Thoughts: No   Homicidal Thoughts: Homicidal Thoughts: No   Thought Process: Coherent; Linear   Orientation: Full (Time, Place and Person)     Memory: Immediate Good; Recent Good   Judgment: Good   Insight: Fair   Concentration: Good   Recall: Good   Fund of Knowledge: Good   Language: Good   Psychomotor Activity: Psychomotor Activity: Normal   Assets: Communication Skills; Desire for Improvement; Financial Resources/Insurance; Housing; Physical Health; Social Support; Resilience; Vocational/Educational; Transportation; Intimacy   Sleep: Sleep: Good Number of Hours of Sleep: 7.5    Review of Systems Review of Systems  Constitutional:  Negative for chills and fever.  Respiratory:  Negative for cough.   Cardiovascular:  Negative for chest pain.  Gastrointestinal:  Negative for nausea and vomiting.  Neurological:  Negative for headaches.  Psychiatric/Behavioral:  Negative for depression, hallucinations, substance abuse and suicidal ideas. The patient is nervous/anxious. The patient does not have insomnia.     Vital Signs: Blood pressure 131/74, pulse (!) 42, temperature 97.6 F (36.4 C), temperature source Oral, resp. rate 20, height 5\' 11"  (1.803 m), weight 87.5 kg, SpO2 99%. Body mass  index is 26.92 kg/m. Physical Exam Constitutional:      General: He is not in acute distress.    Appearance: Normal appearance. He is not ill-appearing, toxic-appearing or diaphoretic.  Pulmonary:     Effort: Pulmonary effort is normal.  Musculoskeletal:        General: Normal range of motion.  Neurological:     Mental  Status: He is alert and oriented to person, place, and time.  Psychiatric:        Behavior: Behavior is not agitated or aggressive. Behavior is cooperative.        Thought Content: Thought content is not paranoid or delusional. Thought content does not include homicidal or suicidal ideation. Thought content does not include homicidal or suicidal plan.        Judgment: Judgment is not inappropriate.    Assets  Assets: Manufacturing systems engineer; Desire for Improvement; Financial Resources/Insurance; Housing; Physical Health; Social Support; Resilience; Vocational/Educational; Transportation; Intimacy  Treatment Plan Summary: Daily contact with patient to assess and evaluate symptoms and progress in treatment and Medication management  Diagnoses / Active Problems: Acute psychosis (HCC) Principal Problem:   Acute psychosis (HCC)  ASSESSMENT: Michael [name] is a 48 y.o., male with past psychiatric history of generalized anxiety, history of benzodiazapine abuse , alcohol dependence and history of cocaine use who presents to the [facility] Involuntary from [facility] Emergency Department for evaluation and management of acute psychosis vs acute mania.    On intake assessment patient was calm, cooperative and engaged throughout the interview.  Patient under IVC.   3/20 Patient continues to do well on Zyprexa for mood stabilization/improvement of manic like symptoms. Will decrease Zyprexa given anticholinergic effects. Has continued to speak with family members and he is approaching his baseline per their report. Medical student contacted girlfriend who feels  comfortable with patient returning home and anticipates discharge tomorrow   Acute Psychosis  R/o Bipolar Disorder  Generalized Anxiety Disorder History of Benzodiazapine Abuse History Alcohol Dependence  History of Cocaine Use    PLAN: Safety and Monitoring:             -- Involuntary admission to inpatient psychiatric unit for safety, stabilization and treatment             -- Daily contact with patient to assess and evaluate symptoms and progress in treatment             -- Patient's case to be discussed in multi-disciplinary team meeting             -- Observation Level : q15 minute checks             -- Vital signs: q12 hours             -- Precautions: suicide, elopement, and assault   2. Interventions (medications, psychoeducation, etc):               -- Decrease Zyprexa to 15 mg at bedtime for psychosis/mania    -- Continue Trazodone 50 mg at bedtime for insomnia               -- medical regimen: Continue Protonix 20 mg daily for GERD, continue Crestor 20 mg daily for hyperlipidemia, continue multivitamin daily, Vitamin D 50,000 units every 7 days              -- Patient does not need nicotine replacement   PRN medications for symptomatic management:              --  continue acetaminophen 650 mg every 6 hours as needed for mild to moderate pain, fever, and headaches              -- continue hydroxyzine 25 mg three times a day as needed for anxiety              -- continue  ondansetron 8 mg every 8 hours as needed for nausea or vomiting              -- continue aluminum-magnesium hydroxide + simethicone 30 mL every 4 hours as needed for heartburn              -- continue Ambien 5 mg at bedtime as needed for insomnia             -- As needed agitation protocol in-place   The risks/benefits/side-effects/alternatives to the above medication were discussed in detail with the patient and time was given for questions. The patient consents to medication trial. FDA black box warnings,  if present, were discussed.   The patient is agreeable with the medication plan, as above. We will monitor the patient's response to pharmacologic treatment, and adjust medications as necessary.   3. Routine and other pertinent labs:             -- Metabolic profile:  BMI: Body mass index is 26.92 kg/m.  Prolactin: No results found for: "PROLACTIN"  Lipid Panel: Lab Results  Component Value Date   CHOL 169 04/09/2023   TRIG 150 (H) 04/09/2023   HDL 52 04/09/2023   CHOLHDL 3.3 04/09/2023   VLDL 30 04/09/2023   LDLCALC 87 04/09/2023   HbgA1c: Hgb A1c MFr Bld (%)  Date Value  04/09/2023 5.1   TSH: No results found for: "TSH"  EKG monitoring: QTc: 461  4. Group Therapy:  -- Encouraged patient to participate in unit milieu and in scheduled group therapies   -- Short Term Goals: Ability to identify changes in lifestyle to reduce recurrence of condition, verbalize feelings, identify and develop effective coping behaviors, maintain clinical measurements within normal limits, and identify triggers associated with substance abuse/mental health issues will improve. Improvement in ability to demonstrate self-control and comply with prescribed medications.  -- Long Term Goals: Improvement in symptoms so as ready for discharge -- Patient is encouraged to participate in group therapy while admitted to the psychiatric unit. -- We will address other chronic and acute stressors, which contributed to the patient's Acute psychosis (HCC) in order to reduce the risk of self-harm at discharge.  5. Discharge Planning:   -- Social work and case management to assist with discharge planning and identification of hospital follow-up needs prior to discharge  -- Estimated LOS: 1-2 more days  -- Discharge Concerns: Need to establish a safety plan; Medication compliance and effectiveness  -- Discharge Goals: Return home with outpatient referrals for mental health follow-up including  medication management/psychotherapy  I certify that inpatient services furnished can reasonably be expected to improve the patient's condition.   Signed: Peterson Ao, MD 04/12/2023, 2:56 PM

## 2023-04-12 NOTE — Group Note (Signed)
 Recreation Therapy Group Note   Group Topic:Leisure Education  Group Date: 04/12/2023 Start Time: 1010 End Time: 1040 Facilitators: Denay Pleitez-McCall, LRT,CTRS Location: [address] Dayroom   Group Topic: Leisure Education  Goal Area(s) Addresses:  Patient will identify positive leisure activities for use post discharge. Patient will identify at least one positive benefit of participation in leisure activities.  Patient will work effectively work with peer by sharing ideas and contributing to Social worker.  Intervention: Team Building, Group Presentation   Activity: Patients were placed in a circle and given a beach ball. Patients were to hit the ball to each like playing volleyball. The ball was to stay moving at all times. The ball could roll or bounce on the floor as long as it didn't stop moving. In the process, LRT timed the patients to see how long they could keep the ball in motion. If at any point the ball stopped, the timer would start over.  Education:  Leisure Scientist, physiological, Special educational needs teacher, Teamwork, Discharge Planning  Education Outcome: Acknowledges education/In group clarification offered/Needs additional education.     Affect/Mood: Appropriate   Participation Level: Engaged   Participation Quality: Independent   Behavior: Appropriate   Speech/Thought Process: Focused   Insight: Good   Judgement: Good   Modes of Intervention: Cooperative Play   Patient Response to Interventions:  Engaged   Education Outcome:  In group clarification offered    Clinical Observations/Individualized Feedback: Pt was engaged and seemed to be enjoying the activity laughing and socializing with peers. Pt was focused and attentive during group. Pt also spoke on how he was feeling weighed down from taking on other people's problems. Pt also expressed needing to learn to not take on others problems and focus more on himself. Pt was receptive to the positive interaction with  peers and was encouraging to peer as well.     Plan: Continue to engage patient in RT group sessions 2-3x/week.   Owen Pagnotta-McCall, LRT,CTRS 04/12/2023 11:48 AM

## 2023-04-12 NOTE — Group Note (Signed)
 Date:  04/12/2023 Time:  8:37 PM  Group Topic/Focus:  Wrap-Up Group:   The focus of this group is to help patients review their daily goal of treatment and discuss progress on daily workbooks.    Participation Level:  Active  Participation Quality:  Appropriate and Attentive  Affect:  Appropriate  Cognitive:  Alert and Appropriate  Insight: Appropriate and Good  Engagement in Group:  Engaged  Modes of Intervention:  Discussion and Education  Additional Comments:  Pt attended and participated in wrap up group this evening and rated their day a 7/10. Pt stated that they are ready for D/C. Pt had a great visit from their wife and completed their goal, which was to discuss budgeting practices with their wife.   Chrisandra Netters 04/12/2023, 8:37 PM

## 2023-04-12 NOTE — Plan of Care (Signed)
  Problem: Activity: Goal: Interest or engagement in activities will improve Outcome: Progressing   Problem: Physical Regulation: Goal: Ability to maintain clinical measurements within normal limits will improve Outcome: Progressing   Problem: Safety: Goal: Periods of time without injury will increase Outcome: Progressing

## 2023-04-12 NOTE — Group Note (Signed)
 Occupational Therapy Group Note  Group Topic: Sleep Hygiene  Group Date: 04/12/2023 Start Time: 1430 End Time: 1500 Facilitators: Ted Mcalpine, OT   Group Description: Group encouraged increased participation and engagement through topic focused on sleep hygiene. Patients reflected on the quality of sleep they typically receive and identified areas that need improvement. Group was given background information on sleep and sleep hygiene, including common sleep disorders. Group members also received information on how to improve one's sleep and introduced a sleep diary as a tool that can be utilized to track sleep quality over a length of time. Group session ended with patients identifying one or more strategies they could utilize or implement into their sleep routine in order to improve overall sleep quality.        Therapeutic Goal(s):  Identify one or more strategies to improve overall sleep hygiene  Identify one or more areas of sleep that are negatively impacted (sleep too much, too little, etc)     Participation Level: Engaged   Participation Quality: Independent   Behavior: Appropriate   Speech/Thought Process: Relevant   Affect/Mood: Appropriate   Insight: Improved   Judgement: Improved      Modes of Intervention: Education  Patient Response to Interventions:  Attentive   Plan: Continue to engage patient in OT groups 2 - 3x/week.  04/12/2023  Ted Mcalpine, OT  Kerrin Champagne, OT

## 2023-04-12 NOTE — [facility] Group Notes (Signed)
 Adult Psychoeducational Group Note  Date:  04/12/2023 Time:  7:35 PM  Group Topic/Focus:  Goals Group:   The focus of this group is to help patients establish daily goals to achieve during treatment and discuss how the patient can incorporate goal setting into their daily lives to aide in recovery. Orientation:   The focus of this group is to educate the patient on the purpose and policies of crisis stabilization and provide a format to answer questions about their admission.  The group details unit policies and expectations of patients while admitted.  Participation Level: Appropriate   Participation Quality:  Appropriate  Affect:  Appropriate  Cognitive:  Appropriate  Insight: Appropriate  Engagement in Group:  Engaged  Modes of Intervention:  Discussion  Additional Comments:  Pt attended the goals group and remained appropriate and engaged throughout the duration of the group.   Sheran Lawless 04/12/2023, 7:35 PM

## 2023-04-12 NOTE — Progress Notes (Signed)
   04/12/23 0602  15 Minute Checks  Location Bedroom  Visual Appearance Calm  Behavior Composed  Sleep (Behavioral Health Patients Only)  Calculate sleep? (Click Yes once per 24 hr at 0600 safety check) Yes  Documented sleep last 24 hours 7.5

## 2023-04-12 NOTE — Hospital Course (Signed)
 Spoke with Bing Neighbors on 04/12/2023 at 10:20am regarding discharge.   She states that her visits have been going great and the patient feels and sounds "more like himself". She has no complaints or concerns with patient coming home. Denies any safety concerns with discharge. She says that patient claims that he is returning back to baseline as well and that patient has really had a great time with the treatment and counseling provided here. She is able to pickup patient tomorrow (04/13/2023) at 1 pm.   - Valetta Mole (Medical Student)

## 2023-04-13 MED ORDER — PRAZOSIN HCL 1 MG PO CAPS: 1.0000 mg | ORAL_CAPSULE | Freq: Every day | ORAL | 0 refills | Status: AC

## 2023-04-13 MED ORDER — VITAMIN D (ERGOCALCIFEROL) 1.25 MG (50000 UNIT) PO CAPS: 50000.0000 [IU] | ORAL_CAPSULE | ORAL | 0 refills | Status: AC

## 2023-04-13 MED ORDER — BACITRACIN ZINC 500 UNIT/GM EX OINT: TOPICAL_OINTMENT | Freq: Two times a day (BID) | CUTANEOUS | Status: AC

## 2023-04-13 MED ORDER — HYDROXYZINE HCL 10 MG PO TABS: 10.0000 mg | ORAL_TABLET | Freq: Three times a day (TID) | ORAL | 0 refills | Status: AC | PRN

## 2023-04-13 MED ORDER — OLANZAPINE 15 MG PO TABS: 15.0000 mg | ORAL_TABLET | Freq: Every day | ORAL | 0 refills | Status: AC
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
## 2023-04-13 NOTE — Plan of Care (Signed)
 Goals resolved, pt d/c.

## 2023-04-13 NOTE — Progress Notes (Signed)
 D: Pt A & O X 3. Denies SI, HI, AVH and pain at this time. D/C home as ordered. Picked up in lobby by his wife.  A: D/C instructions reviewed with pt including prescriptions and follow up appointments; compliance encouraged. All belongings from locker 17 returned to pt at time of departure. Scheduled medications given with verbal education and effects monitored. Safety checks maintained without incident till time of d/c.  R: Pt receptive to care. Compliant with medications when offered. Denies adverse drug reactions when assessed. Verbalized understanding related to d/c instructions. Signed belonging sheet in agreement with items received from locker. Ambulatory with a steady gait. Appears to be in no physical distress at time of departure.

## 2023-04-13 NOTE — Progress Notes (Signed)
   04/12/23 2100  Psych Admission Type (Psych Patients Only)  Admission Status Involuntary  Psychosocial Assessment  Patient Complaints Anxiety (Pt shared he is looking forward to discharge and verbalized his ready. Pt stated that he wants to use every opportunity to have positive interactions daily. He endorsed his support system is in place.  Pt stated he will continue aftercare appts. 5/10 anx)  Eye Contact Intense  Facial Expression Pensive  Affect Appropriate to circumstance  Speech Logical/coherent;Rapid  Interaction Assertive  Appearance/Hygiene Unremarkable  Behavior Characteristics Cooperative  Mood Anxious;Euthymic  Thought Process  Coherency WDL  Content WDL  Delusions None reported or observed  Perception WDL  Hallucination None reported or observed  Judgment Impaired  Confusion None  Danger to Self  Current suicidal ideation? Denies  Self-Injurious Behavior No self-injurious ideation or behavior indicators observed or expressed   Agreement Not to Harm Self Yes  Description of Agreement Verbal  Danger to Others  Danger to Others None reported or observed

## 2023-04-13 NOTE — [facility] Group Notes (Signed)
 Adult Psychoeducational Group Note  Date:  04/13/2023 Time:  9:36 AM  Group Topic/Focus:  Goals Group:   The focus of this group is to help patients establish daily goals to achieve during treatment and discuss how the patient can incorporate goal setting into their daily lives to aide in recovery. Orientation:   The focus of this group is to educate the patient on the purpose and policies of crisis stabilization and provide a format to answer questions about their admission.  The group details unit policies and expectations of patients while admitted.  Participation Level:  Did Not Attend    Additional Comments:  Did not attend  Michael [name] 04/13/2023, 9:36 AM

## 2023-04-13 NOTE — Progress Notes (Signed)
 Recreation Therapy Notes  INPATIENT RECREATION TR PLAN  Patient Details Name: Michael [name] MRN: [id number] DOB: 18-Jan-1976 Today's Date: 04/13/2023  Rec Therapy Plan Is patient appropriate for Therapeutic Recreation?: Yes Treatment times per week: about 3 days Estimated Length of Stay: 5-7 days TR Treatment/Interventions: Group participation (Comment)  Discharge Criteria Pt will be discharged from therapy if:: Discharged Treatment plan/goals/alternatives discussed and agreed upon by:: Patient/family  Discharge Summary Short term goals set: See patient care plan Short term goals met: Not met Progress toward goals comments: Groups attended Which groups?: AAA/T, Stress management, Leisure education Reason goals not met: Pt didn't exhibit enough spontaneous communication during group session. Therapeutic equipment acquired: N/A Reason patient discharged from therapy: Discharge from hospital Pt/family agrees with progress & goals achieved: Yes Date patient discharged from therapy: 04/13/23    Arbor Leer-McCall, LRT,CTRS Tajee Savant A Philip Eckersley-McCall 04/13/2023, 1:29 PM

## 2023-04-13 NOTE — Plan of Care (Signed)
  Problem: Education: Goal: Knowledge of [facility] Education information/materials will improve Outcome: Progressing Goal: Mental status will improve Outcome: Progressing Goal: Verbalization of understanding the information provided will improve Outcome: Progressing   Problem: Activity: Goal: Sleeping patterns will improve Outcome: Progressing

## 2023-04-13 NOTE — [facility] Suicide Risk Assessment (Signed)
 [facility] Discharge Suicide Risk Assessment  Principal Problem: Acute psychosis (HCC) Discharge Diagnoses: Principal Problem:   Acute psychosis (HCC)   Reason for Admission:  Michael [name] is a 48 y.o., male with past psychiatric history of generalized anxiety, history of benzodiazapine abuse , alcohol dependence and history of cocaine use who presents to the [facility] Involuntary from [facility] Emergency Department for evaluation and management of acute psychosis vs acute mania.   Hospital Summary  During the patient's hospitalization, patient had extensive initial psychiatric evaluation, and follow-up psychiatric evaluations every day.   Psychiatric diagnoses provided upon initial assessment:  Acute Psychosis  Generalized Anxiety Disorder History of Benzodiazapine Abuse History Alcohol Dependence  History of Cocaine Use    Patient's psychiatric medications were adjusted on admission: - Started Olanzapine 5 mg at bedtime for acute psychosis/mania  - Started Trazodone 50 mg at bedtime for insomnia  - Started Hydroxyzine 25 mg three times daily as needed for anxiety    During the hospitalization, other adjustments were made to the patient's psychiatric medication regimen:  - Increased Olanzapine 15 mg at bedtime for acute psychosis/mania - Started Prazosin 1 mg at bedtime for sleep disturbance/abnormal dreams - Decreased Hydroxyzine 10 mg three times daily as needed for anxiety    Patient's care was discussed during the interdisciplinary team meeting every day during the hospitalization.   The patient endorses the following side effects to prescribed psychiatric medication: dry mouth  .   Gradually, patient started adjusting to milieu. The patient was evaluated each day by a clinical provider to ascertain response to treatment. Improvement was noted by the patient's report of decreasing symptoms, improved sleep and appetite, affect, medication tolerance, behavior, and  participation in unit programming.  Patient was asked each day to complete a self inventory noting mood, mental status, pain, new symptoms, anxiety and concerns. Symptoms were reported as significantly decreased or resolved completely by discharge.   The patient reports that their mood is stable. The patient denied having suicidal thoughts for more than 48 hours prior to discharge.  Patient denies having homicidal thoughts.  Patient denies having auditory hallucinations.  Patient denies any visual hallucinations or other symptoms of psychosis. The patient was motivated to continue taking medication with a goal of continued improvement in mental health.    Symptoms were reported as significantly decreased or resolved completely by discharge.    On day of discharge, the patient reports that their mood is stable. The patient denied having suicidal thoughts for more than 48 hours prior to discharge.  Patient denies having homicidal thoughts.  Patient denies having auditory hallucinations.  Patient denies any visual hallucinations or other symptoms of psychosis. The patient was motivated to continue taking medication with a goal of continued improvement in mental health.    The patient reports their target psychiatric symptoms of mania/acute psychosis responded well to the psychiatric medications, and the patient reports overall benefit from psychiatric hospitalization. Supportive psychotherapy was provided to the patient. The patient also participated in regular group therapy while hospitalized. Coping skills, problem solving as well as relaxation therapies were also part of the unit programming.   Labs were reviewed with the patient, and abnormal results were discussed with the patient.   The patient is able to verbalize their individual safety plan to this provider.   # It is recommended to the patient to continue psychiatric medications as prescribed, after discharge from the hospital.     # It is  recommended to the  patient to follow up with your outpatient psychiatric provider and PCP.   # It was discussed with the patient, the impact of alcohol, drugs, tobacco have been there overall psychiatric and medical wellbeing, and total abstinence from substance use was recommended.   # Prescriptions provided or sent directly to preferred pharmacy at discharge. Patient agreeable to plan. Given opportunity to ask questions. Appears to feel comfortable with discharge.    # In the event of worsening symptoms, the patient is instructed to call the crisis hotline, 911 and or go to the nearest ED for appropriate evaluation and treatment of symptoms. To follow-up with primary care provider for other medical issues, concerns and or health care needs   # Patient was discharged home with a plan to follow up as noted below.  Total Time spent with patient: 20 minutes  Musculoskeletal: Strength & Muscle Tone: within normal limits Gait & Station: normal Patient leans: N/A  Psychiatric Specialty Exam  Presentation  General Appearance: Appropriate for Environment; Casual  Eye Contact:Good  Speech:Clear and Coherent; Normal Rate  Speech Volume:Normal  Handedness:Right   Mood and Affect  Mood:Euthymic  Duration of Depression Symptoms: No data recorded Affect:Appropriate; Congruent   Thought Process  Thought Processes:Coherent; Goal Directed; Linear  Descriptions of Associations:Intact  Orientation:Full (Time, Place and Person)  Thought Content:Logical  History of Schizophrenia/Schizoaffective disorder:No data recorded Duration of Psychotic Symptoms:No data recorded Hallucinations:Hallucinations: None  Ideas of Reference:None  Suicidal Thoughts:Suicidal Thoughts: No  Homicidal Thoughts:Homicidal Thoughts: No   Sensorium  Memory:Immediate Good; Recent Good  Judgment:Good  Insight:Good   Executive Functions  Concentration:Good  Attention  Span:Good  Recall:Good  Fund of Knowledge:Good  Language:Good   Psychomotor Activity  Psychomotor Activity:Psychomotor Activity: Normal   Assets  Assets:Communication Skills; Desire for Improvement; Financial Resources/Insurance; Transportation; Talents/Skills; Social Support; Resilience; Housing; Intimacy   Sleep  Sleep:Sleep: Good Number of Hours of Sleep: 8   Physical Exam: Physical Exam Constitutional:      Appearance: Normal appearance.  Eyes:     Conjunctiva/sclera: Conjunctivae normal.  Pulmonary:     Effort: Pulmonary effort is normal.  Musculoskeletal:        General: Normal range of motion.  Neurological:     Mental Status: He is alert and oriented to person, place, and time.  Psychiatric:        Attention and Perception: He does not perceive auditory or visual hallucinations.        Mood and Affect: Mood is not anxious or depressed. Affect is not angry.        Speech: Speech is not rapid and pressured.        Behavior: Behavior is not aggressive or combative. Behavior is cooperative.        Thought Content: Thought content is not paranoid or delusional. Thought content does not include homicidal or suicidal ideation. Thought content does not include homicidal or suicidal plan.    Review of Systems  Constitutional:  Negative for chills and fever.  Respiratory:  Negative for cough.   Cardiovascular:  Negative for chest pain.  Gastrointestinal:  Negative for nausea and vomiting.  Neurological:  Negative for headaches.  Psychiatric/Behavioral:  Negative for depression, hallucinations, substance abuse and suicidal ideas. The patient is not nervous/anxious.    Blood pressure 115/64, pulse (!) 55, temperature 97.6 F (36.4 C), temperature source Oral, resp. rate 20, height 5\' 11"  (1.803 m), weight 87.5 kg, SpO2 99%. Body mass index is 26.92 kg/m.  Mental Status Per Nursing Assessment::  On Admission:  NA  Demographic Factors:  Male  Loss  Factors: NA  Historical Factors: NA  Risk Reduction Factors:   Responsible for children under 6 years of age, Sense of responsibility to family, Religious beliefs about death, Employed, Living with another person, especially a relative, Positive social support, Positive therapeutic relationship, and Positive coping skills or problem solving skills  Continued Clinical Symptoms:  Severe Anxiety and/or Agitation Alcohol/Substance Abuse/Dependencies  Cognitive Features That Contribute To Risk:  None    Suicide Risk:  Minimal: No identifiable suicidal ideation.  Patients presenting with no risk factors but with morbid ruminations; may be classified as minimal risk based on the severity of the depressive symptoms    Follow-up Information     [facility] up on 04/25/2023.   Why: You have a nurse call appointment on  04/25/23 at 10:00 am .  You have a hospital follow up appointment, to obtain therapy and medication management services on 05/02/23 at 1:30 pm, in person. Contact information: [address] [facility] [place] [address] [phone]                 Plan Of Care/Follow-up recommendations:   Activity: as tolerated   Diet: heart healthy   Other: -Follow-up with your outpatient psychiatric provider -instructions on appointment date, time, and address (location) are provided to you in discharge paperwork.   -Take your psychiatric medications as prescribed at discharge - instructions are provided to you in the discharge paperwork   -Follow-up with outpatient primary care doctor and other specialists -for management of chronic medical disease, including: health maintenance checks   -Testing: Follow-up with outpatient provider for abnormal lab results:  Vitamin D Deficiency 27.24   -Recommend abstinence from alcohol, tobacco, and other illicit drug use at discharge.    -If your psychiatric symptoms recur, worsen, or if you have side  effects to your psychiatric medications, call your outpatient psychiatric provider, 911, [phone] or go to the nearest emergency department.   -If suicidal thoughts recur, call your outpatient psychiatric provider, 911, 988 or go to the nearest emergency department.  Signed: Peterson Ao, MD 04/13/2023, 9:01 AM

## 2023-04-13 NOTE — Progress Notes (Signed)
  [facility] Adult Case Management Discharge Plan :  Will you be returning to the same living situation after discharge:  Yes,  patient will return home to his wife. At discharge, do you have transportation home?: Yes,  patient's wife, Bing Neighbors (partner) [phone], will pick him up at 12 PM. Do you have the ability to pay for your medications: Yes,  patient has insurance  Release of information consent forms completed and in the chart;  Patient's signature needed at discharge.  Patient to Follow up at:  Follow-up Information     [facility] Follow up on 04/25/2023.   Why: You have a nurse call appointment on  04/25/23 at 10:00 am .  You have a hospital follow up appointment, to obtain therapy and medication management services on 05/02/23 at 1:30 pm, in person. Contact information: [address] [facility] [place] [address] [phone]                 Next level of care provider has access to [facility] Link:no  Safety Planning and Suicide Prevention discussed: Valentino Hue,  Bing Neighbors (partner) [phone]  Has patient been referred to the Quitline?:  No, patient reported that he quit smoking 3 years ago.    Patient has been referred for addiction treatment:  Patient said that he used substances ("everything") in December 2024.   He said that it's no longer an issue, and if needed, he will address it in therapy.   Runell Kovich O Marques Ericson, LCSWA 04/13/2023, 9:17 AM

## 2023-04-13 NOTE — Discharge Instructions (Addendum)
-  Follow-up with your outpatient psychiatric provider -instructions on appointment date, time, and address (location) are provided to you in discharge paperwork.  -Take your psychiatric medications as prescribed at discharge - instructions are provided to you in the discharge paperwork  -Follow-up with outpatient primary care doctor and other specialists -for management of preventative medicine and any chronic medical disease.  -Recommend abstinence from alcohol, tobacco, and other illicit drug use at discharge.   -If your psychiatric symptoms recur, worsen, or if you have side effects to your psychiatric medications, call your outpatient psychiatric provider, 911, 988 or go to the nearest emergency department.  -If suicidal thoughts occur, call your outpatient psychiatric provider, 911, 988 or go to the nearest emergency department.  Naloxone (Narcan) can help reverse an overdose when given to the victim quickly.  [facility] offers free naloxone kits and instructions/training on its use.  Add naloxone to your first aid kit and you can help save a life.   Pick up your free kit at the following locations:   [place]:  [facility] Division of [facility], [address] [place] [address] [phone]) [facility] [address] [place] [address] [phone]) [facility] [address] [place] [address]  [place]: [facility] [address] [place] [address] ([phone]) [facility] [address] [place] [address] [phone]) ____________________________________________________________________________________________________________________________ [facility] [address] [place], [place], [address] [phone] phone  New Patient Assessment/Therapy Walk-Ins:  Monday and Wednesday: 8 am  until slots are full. Every 1st and 2nd Fridays of the month: 1 pm - 5 pm.  NO ASSESSMENT/THERAPY WALK-INS ON TUESDAYS OR THURSDAYS  New Patient Assessment/Medication Management Walk-Ins:  Monday - Friday:  8 am - 11 am.  For all walk-ins, we ask that you arrive by 7:00 am because patients will be seen in the order of arrival.  Availability is limited; therefore, you may not be seen on the same day that you walk-in.  Our goal is to serve and meet the needs of our community to the best of our ability.

## 2023-04-13 NOTE — Plan of Care (Signed)
 Patient didn't complete goal but showed improvement throughout group sessions.    Aleathea Pugmire-McCall, LRT,CTRS
